# Patient Record
Sex: Male | Born: 1935 | Hispanic: Yes | Smoking: Former smoker
Health system: Southern US, Community
[De-identification: ages and names within clinical notes are randomized; demographics above are authoritative.]

## PROBLEM LIST (undated history)

## (undated) DIAGNOSIS — Z974 Presence of external hearing-aid: Secondary | ICD-10-CM

## (undated) DIAGNOSIS — N189 Chronic kidney disease, unspecified: Secondary | ICD-10-CM

## (undated) DIAGNOSIS — F32A Depression, unspecified: Secondary | ICD-10-CM

## (undated) DIAGNOSIS — M25552 Pain in left hip: Secondary | ICD-10-CM

## (undated) DIAGNOSIS — Z972 Presence of dental prosthetic device (complete) (partial): Secondary | ICD-10-CM

## (undated) DIAGNOSIS — R1032 Left lower quadrant pain: Secondary | ICD-10-CM

## (undated) DIAGNOSIS — R531 Weakness: Secondary | ICD-10-CM

## (undated) DIAGNOSIS — R972 Elevated prostate specific antigen [PSA]: Secondary | ICD-10-CM

## (undated) DIAGNOSIS — K259 Gastric ulcer, unspecified as acute or chronic, without hemorrhage or perforation: Secondary | ICD-10-CM

## (undated) DIAGNOSIS — F329 Major depressive disorder, single episode, unspecified: Secondary | ICD-10-CM

## (undated) DIAGNOSIS — I10 Essential (primary) hypertension: Secondary | ICD-10-CM

## (undated) DIAGNOSIS — K449 Diaphragmatic hernia without obstruction or gangrene: Secondary | ICD-10-CM

## (undated) DIAGNOSIS — K279 Peptic ulcer, site unspecified, unspecified as acute or chronic, without hemorrhage or perforation: Secondary | ICD-10-CM

## (undated) DIAGNOSIS — R0602 Shortness of breath: Secondary | ICD-10-CM

## (undated) DIAGNOSIS — R053 Chronic cough: Secondary | ICD-10-CM

## (undated) DIAGNOSIS — N400 Enlarged prostate without lower urinary tract symptoms: Secondary | ICD-10-CM

## (undated) DIAGNOSIS — K2100 Gastro-esophageal reflux disease with esophagitis, without bleeding: Secondary | ICD-10-CM

## (undated) HISTORY — PX: CHOLECYSTECTOMY: SHX55

## (undated) HISTORY — PX: OTHER SURGICAL HISTORY: SHX169

## (undated) HISTORY — DX: Essential (primary) hypertension: I10

## (undated) HISTORY — DX: Chronic kidney disease, unspecified: N18.9

## (undated) HISTORY — DX: Pain in left hip: M25.552

## (undated) HISTORY — DX: Left lower quadrant pain: R10.32

## (undated) HISTORY — DX: Elevated prostate specific antigen (PSA): R97.20

## (undated) HISTORY — DX: Weakness: R53.1

## (undated) HISTORY — DX: Benign prostatic hyperplasia without lower urinary tract symptoms: N40.0

---
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
## 2002-10-07 HISTORY — PX: LEG SURGERY: SHX1003

## 2013-09-14 ENCOUNTER — Ambulatory Visit: Payer: Self-pay

## 2013-10-23 ENCOUNTER — Emergency Department: Payer: Self-pay | Admitting: Emergency Medicine

## 2013-10-23 LAB — CBC
HCT: 36.9 % — ABNORMAL LOW (ref 40.0–52.0)
HGB: 13 g/dL (ref 13.0–18.0)
MCH: 31 pg (ref 26.0–34.0)
MCHC: 35.2 g/dL (ref 32.0–36.0)
MCV: 88 fL (ref 80–100)
Platelet: 166 10*3/uL (ref 150–440)
RBC: 4.2 10*6/uL — ABNORMAL LOW (ref 4.40–5.90)
RDW: 13.4 % (ref 11.5–14.5)
WBC: 5.8 10*3/uL (ref 3.8–10.6)

## 2013-10-23 LAB — URINALYSIS, COMPLETE
BACTERIA: NONE SEEN
Bilirubin,UR: NEGATIVE
GLUCOSE, UR: NEGATIVE mg/dL (ref 0–75)
KETONE: NEGATIVE
Leukocyte Esterase: NEGATIVE
NITRITE: NEGATIVE
PROTEIN: NEGATIVE
Ph: 6 (ref 4.5–8.0)
Specific Gravity: 1.017 (ref 1.003–1.030)
WBC UR: 1 /HPF (ref 0–5)

## 2013-10-23 LAB — COMPREHENSIVE METABOLIC PANEL
ALBUMIN: 3.6 g/dL (ref 3.4–5.0)
ALT: 30 U/L (ref 12–78)
Alkaline Phosphatase: 86 U/L
Anion Gap: 6 — ABNORMAL LOW (ref 7–16)
BUN: 13 mg/dL (ref 7–18)
Bilirubin,Total: 0.5 mg/dL (ref 0.2–1.0)
CREATININE: 1.09 mg/dL (ref 0.60–1.30)
Calcium, Total: 8.9 mg/dL (ref 8.5–10.1)
Chloride: 104 mmol/L (ref 98–107)
Co2: 27 mmol/L (ref 21–32)
GLUCOSE: 95 mg/dL (ref 65–99)
OSMOLALITY: 274 (ref 275–301)
Potassium: 3.5 mmol/L (ref 3.5–5.1)
SGOT(AST): 19 U/L (ref 15–37)
Sodium: 137 mmol/L (ref 136–145)
Total Protein: 7 g/dL (ref 6.4–8.2)

## 2013-11-09 ENCOUNTER — Ambulatory Visit: Payer: Self-pay | Admitting: Gastroenterology

## 2013-11-11 LAB — PATHOLOGY REPORT

## 2014-03-19 DIAGNOSIS — N183 Chronic kidney disease, stage 3 unspecified: Secondary | ICD-10-CM

## 2014-03-19 HISTORY — DX: Chronic kidney disease, stage 3 unspecified: N18.30

## 2014-07-18 ENCOUNTER — Ambulatory Visit: Payer: Self-pay | Admitting: Internal Medicine

## 2014-07-27 ENCOUNTER — Ambulatory Visit: Payer: Self-pay | Admitting: Gastroenterology

## 2015-03-15 DIAGNOSIS — E782 Mixed hyperlipidemia: Secondary | ICD-10-CM | POA: Insufficient documentation

## 2015-03-15 DIAGNOSIS — I340 Nonrheumatic mitral (valve) insufficiency: Secondary | ICD-10-CM | POA: Insufficient documentation

## 2015-08-01 ENCOUNTER — Encounter: Payer: Self-pay | Admitting: *Deleted

## 2015-08-10 ENCOUNTER — Ambulatory Visit (INDEPENDENT_AMBULATORY_CARE_PROVIDER_SITE_OTHER): Payer: Medicare Other | Admitting: Urology

## 2015-08-10 ENCOUNTER — Encounter: Payer: Self-pay | Admitting: Urology

## 2015-08-10 VITALS — BP 160/64 | HR 88 | Ht 62.0 in | Wt 142.1 lb

## 2015-08-10 DIAGNOSIS — R972 Elevated prostate specific antigen [PSA]: Secondary | ICD-10-CM

## 2015-08-10 DIAGNOSIS — N138 Other obstructive and reflux uropathy: Secondary | ICD-10-CM | POA: Insufficient documentation

## 2015-08-10 DIAGNOSIS — N401 Enlarged prostate with lower urinary tract symptoms: Secondary | ICD-10-CM | POA: Diagnosis not present

## 2015-08-10 NOTE — Progress Notes (Signed)
08/10/2015 10:37 AM   Bradley Wolf Nov 17, 1928 [id number]  Referring provider: No referring provider defined for this encounter.  Chief Complaint  Patient presents with  . Elevated PSA    6 month recheck  . Benign Prostatic Hypertrophy    HPI: Patient is an 81 year old Hispanic male who presents today with interpreter, Bradley Wolf, with a history of elevated PSA and BPH with LUTS who presents today for 6 month follow-up.  BPH WITH LUTS His IPSS score today is 8, which is moderate lower urinary tract symptomatology. He is mixed with his quality life due to his urinary symptoms.  His major complaint today a weak urinary stream at night.  He has nocturia x 1.  He has had these symptoms for several years.  He denies any dysuria, hematuria or suprapubic pain.   He currently taking tamsulosin and finasteride.  He also denies any recent fevers, chills, nausea or vomiting.  He does not have a family history of PCa.      IPSS      08/10/15 0900       International Prostate Symptom Score   How often have you had the sensation of not emptying your bladder? Not at All     How often have you had to urinate less than every two hours? Almost always     How often have you found you stopped and started again several times when you urinated? Less than 1 in 5 times     How often have you found it difficult to postpone urination? Not at All     How often have you had a weak urinary stream? Not at All     How often have you had to strain to start urination? Less than 1 in 5 times     How many times did you typically get up at night to urinate? 1 Time     Total IPSS Score 8     Quality of Life due to urinary symptoms   If you were to spend the rest of your life with your urinary condition just the way it is now how would you feel about that? Mixed        Score:  1-7 Mild 8-19 Moderate 20-35 Severe  Elevated PSA Patient's PSA has been as high as 8.3, but it has reduced with the addition  of finasteride to 3.9.     PMH: Past Medical History  Diagnosis Date  . HTN (hypertension)   . CKD (chronic kidney disease)   . Left hip pain   . BPH (benign prostatic hyperplasia)   . Elevated PSA   . Generalized weakness   . Left groin pain     Surgical History: Past Surgical History  Procedure Laterality Date  . Leg surgery  2004    due to accident    Home Medications:    Medication List       This list is accurate as of: 08/10/15 10:37 AM.  Always use your most recent med list.               acetaminophen 325 MG tablet  Commonly known as:  TYLENOL  Take by mouth.     finasteride 5 MG tablet  Commonly known as:  PROSCAR  Take 5 mg by mouth daily.     fluticasone 50 MCG/ACT nasal spray  Commonly known as:  FLONASE  Place into the nose.     risperiDONE 0.25 MG tablet  Commonly known as:  RISPERDAL  Take 0.25 mg by mouth at bedtime.     tamsulosin 0.4 MG Caps capsule  Commonly known as:  FLOMAX  Take 0.4 mg by mouth.        Allergies: No Known Allergies  Family History: Family History  Problem Relation Age of Onset  . Benign prostatic hyperplasia Brother   . Prostate cancer Neg Hx   . Kidney disease Neg Hx     Social History:  reports that he has never smoked. He does not have any smokeless tobacco history on file. He reports that he does not drink alcohol or use illicit drugs.  ROS: UROLOGY Frequent Urination?: No Hard to postpone urination?: No Burning/pain with urination?: No Get up at night to urinate?: No Leakage of urine?: No Urine stream starts and stops?: No Trouble starting stream?: Yes Do you have to strain to urinate?: No Blood in urine?: No Urinary tract infection?: No Sexually transmitted disease?: No Injury to kidneys or bladder?: No Painful intercourse?: No Weak stream?: No Erection problems?: No Penile pain?: No  Gastrointestinal Nausea?: No Vomiting?: No Indigestion/heartburn?: No Diarrhea?: No Constipation?:  No  Constitutional Fever: No Night sweats?: No Weight loss?: No Fatigue?: No  Skin Skin rash/lesions?: No Itching?: No  Eyes Blurred vision?: No Double vision?: No  Ears/Nose/Throat Sore throat?: No Sinus problems?: No  Hematologic/Lymphatic Swollen glands?: No Easy bruising?: No  Cardiovascular Leg swelling?: No Chest pain?: No  Respiratory Cough?: No Shortness of breath?: No  Endocrine Excessive thirst?: No  Musculoskeletal Back pain?: No Joint pain?: No  Neurological Headaches?: No Dizziness?: No  Psychologic Depression?: No Anxiety?: No  Physical Exam: BP 160/64 mmHg  Pulse 88  Ht 5\' 2"  (1.575 m)  Wt 142 lb 1.6 oz (64.456 kg)  BMI 25.98 kg/m2  GU: No CVA tenderness.  No bladder fullness or masses.  Patient with uncircumcised phallus. Foreskin easily retracted  Urethral meatus is patent.  No penile discharge. No penile lesions or rashes. Scrotum without lesions, cysts, rashes and/or edema.  Testicles are located scrotally bilaterally. No masses are appreciated in the testicles. Left and right epididymis are normal. Rectal: Patient with  normal sphincter tone. Anus and perineum without scarring or rashes. No rectal masses are appreciated. Prostate is approximately 90 grams (could not palpated entire gland due to buttocks tissue)  Seminal vesicles are normal.   Laboratory Data: Lab Results  Component Value Date   WBC 5.8 10/23/2013   HGB 13.0 10/23/2013   HCT 36.9* 10/23/2013   MCV 88 10/23/2013   PLT 166 10/23/2013    Lab Results  Component Value Date   CREATININE 1.09 10/23/2013  PSA History:  7.9 ng/mL on 04/14/2014  8.3 ng/mL on 06/15/2014  3.9 ng/mL on 02/09/2015    Assessment & Plan:    1. BPH (benign prostatic hyperplasia) with LUTS:    IPSS score is 8/3.  He is mixed because he does not like to take the medications.  I stated he could discontinue the medications and restart them if his urinary symptoms worsened.  He will RTC in 6  months for PSA, exam and IPSS score.  - PSA  2. Elevated PSA:    Patient's PSA has been as high as 8.3, but it has reduced with the addition of finasteride to 3.9.   It may start to increase if he discontinues the finasteride.  He will RTC in 6 months for a PSA and exam.   Return in about 6 months (around 02/07/2016) for IPSS and  PVR.  Zara Council, [facility] [address] [place], [place] [address] [phone]

## 2015-08-11 LAB — PSA: PROSTATE SPECIFIC AG, SERUM: 2.5 ng/mL (ref 0.0–4.0)

## 2016-01-31 ENCOUNTER — Other Ambulatory Visit: Payer: Self-pay

## 2016-01-31 DIAGNOSIS — R972 Elevated prostate specific antigen [PSA]: Secondary | ICD-10-CM

## 2016-02-01 ENCOUNTER — Other Ambulatory Visit: Payer: Medicare Other

## 2016-02-01 DIAGNOSIS — R972 Elevated prostate specific antigen [PSA]: Secondary | ICD-10-CM

## 2016-02-02 LAB — PSA: PROSTATE SPECIFIC AG, SERUM: 1.9 ng/mL (ref 0.0–4.0)

## 2016-02-08 ENCOUNTER — Ambulatory Visit: Payer: Medicare Other | Admitting: Urology

## 2016-04-23 ENCOUNTER — Ambulatory Visit: Payer: Medicare Other | Admitting: Urology

## 2016-04-25 ENCOUNTER — Ambulatory Visit (INDEPENDENT_AMBULATORY_CARE_PROVIDER_SITE_OTHER): Payer: Medicare Other | Admitting: Urology

## 2016-04-25 ENCOUNTER — Encounter: Payer: Self-pay | Admitting: Urology

## 2016-04-25 VITALS — BP 169/66 | HR 72 | Ht 63.0 in | Wt 151.9 lb

## 2016-04-25 DIAGNOSIS — N528 Other male erectile dysfunction: Secondary | ICD-10-CM | POA: Diagnosis not present

## 2016-04-25 DIAGNOSIS — N400 Enlarged prostate without lower urinary tract symptoms: Secondary | ICD-10-CM | POA: Diagnosis not present

## 2016-04-25 DIAGNOSIS — N401 Enlarged prostate with lower urinary tract symptoms: Secondary | ICD-10-CM | POA: Diagnosis not present

## 2016-04-25 DIAGNOSIS — R972 Elevated prostate specific antigen [PSA]: Secondary | ICD-10-CM

## 2016-04-25 DIAGNOSIS — N529 Male erectile dysfunction, unspecified: Secondary | ICD-10-CM

## 2016-04-25 DIAGNOSIS — N138 Other obstructive and reflux uropathy: Secondary | ICD-10-CM

## 2016-04-25 LAB — BLADDER SCAN AMB NON-IMAGING: SCAN RESULT: 0

## 2016-04-25 NOTE — Progress Notes (Signed)
10:06 AM   Bradley Wolf 11-Sep-1929 [id number]  Referring provider: Lauro Regulus, MD [address] [facility] [place], [place] [address]  Chief Complaint  Patient presents with  . Benign Prostatic Hypertrophy    6 month follow up   . Elevated PSA    HPI: Patient is an 82 year old Hispanic male who presents today with interpreter, [name], with a history of elevated PSA and BPH with LUTS who presents today for 6 month follow-up.  BPH WITH LUTS His IPSS score today is 17, which is moderate lower urinary tract symptomatology. He is mostly dissatisfied with his quality life due to his urinary symptoms.  His major complaint today a weak and slow urinary stream at night.   He has had these symptoms for several years.  He denies any dysuria, hematuria or suprapubic pain.   He currently taking tamsulosin and finasteride.  He also denies any recent fevers, chills, nausea or vomiting.  He does not have a family history of PCa.      IPSS      04/25/16 0900       International Prostate Symptom Score   How often have you had the sensation of not emptying your bladder? More than half the time     How often have you had to urinate less than every two hours? About half the time     How often have you found you stopped and started again several times when you urinated? More than half the time     How often have you found it difficult to postpone urination? Not at All     How often have you had a weak urinary stream? About half the time     How often have you had to strain to start urination? Not at All     How many times did you typically get up at night to urinate? 3 Times     Total IPSS Score 17     Quality of Life due to urinary symptoms   If you were to spend the rest of your life with your urinary condition just the way it is now how would you feel about that? Mostly Disatisfied        Score:  1-7 Mild 8-19 Moderate 20-35 Severe  Elevated  PSA Patient's PSA has been as high as 8.3, but it has reduced with the addition of finasteride to 3.9.  In April, it was 1.9 ng/mL.    Erectile dysfunction Patient has been a widower for three years.  He has now recently become involved with a woman and cannot have a firm erection.  He has a good libido.  He denies painful erections and curvature to his erections.    PMH: Past Medical History  Diagnosis Date  . HTN (hypertension)   . CKD (chronic kidney disease)   . Left hip pain   . BPH (benign prostatic hyperplasia)   . Elevated PSA   . Generalized weakness   . Left groin pain     Surgical History: Past Surgical History  Procedure Laterality Date  . Leg surgery  2004    due to accident    Home Medications:    Medication List       This list is accurate as of: 04/25/16 10:06 AM.  Always use your most recent med list.               acetaminophen 325 MG tablet  Commonly known as:  TYLENOL  Take by mouth. Reported on 04/25/2016     azelastine 0.1 % nasal spray  Commonly known as:  ASTELIN  Place into the nose.     finasteride 5 MG tablet  Commonly known as:  PROSCAR  Take 5 mg by mouth daily.     fluticasone 50 MCG/ACT nasal spray  Commonly known as:  FLONASE  Place into the nose.     pantoprazole 40 MG tablet  Commonly known as:  PROTONIX  Take by mouth.     risperiDONE 0.25 MG tablet  Commonly known as:  RISPERDAL  Take 0.25 mg by mouth at bedtime.     tamsulosin 0.4 MG Caps capsule  Commonly known as:  FLOMAX  Take 0.4 mg by mouth.        Allergies: No Known Allergies  Family History: Family History  Problem Relation Age of Onset  . Benign prostatic hyperplasia Brother   . Prostate cancer Neg Hx   . Kidney disease Neg Hx     Social History:  reports that he has never smoked. He does not have any smokeless tobacco history on file. He reports that he does not drink alcohol or use illicit drugs.  ROS: UROLOGY Frequent Urination?: Yes Hard  to postpone urination?: Yes Burning/pain with urination?: No Get up at night to urinate?: Yes Leakage of urine?: No Urine stream starts and stops?: Yes Trouble starting stream?: No Do you have to strain to urinate?: Yes Blood in urine?: No Urinary tract infection?: No Sexually transmitted disease?: No Injury to kidneys or bladder?: No Painful intercourse?: No Weak stream?: Yes Erection problems?: No Penile pain?: No  Gastrointestinal Nausea?: No Vomiting?: No Indigestion/heartburn?: No Diarrhea?: No Constipation?: No  Constitutional Fever: No Night sweats?: No Weight loss?: No Fatigue?: No  Skin Skin rash/lesions?: Yes Itching?: No  Eyes Blurred vision?: No Double vision?: No  Ears/Nose/Throat Sore throat?: No Sinus problems?: No  Hematologic/Lymphatic Swollen glands?: No Easy bruising?: No  Cardiovascular Leg swelling?: No Chest pain?: No  Respiratory Cough?: No Shortness of breath?: No  Endocrine Excessive thirst?: No  Musculoskeletal Back pain?: No Joint pain?: No  Neurological Headaches?: No Dizziness?: No  Psychologic Depression?: No Anxiety?: No  Physical Exam: BP 169/66 mmHg  Pulse 72  Ht  (1.6 m)  Wt 151 lb 14.4 oz (68.901 kg)  BMI 26.91 kg/m2  GU: No CVA tenderness.  No bladder fullness or masses.  Patient with uncircumcised phallus. Foreskin easily retracted  Urethral meatus is patent.  No penile discharge. No penile lesions or rashes. Scrotum without lesions, cysts, rashes and/or edema.  Testicles are located scrotally bilaterally. No masses are appreciated in the testicles. Left and right epididymis are normal. Rectal: Patient with  normal sphincter tone. Anus and perineum without scarring or rashes. No rectal masses are appreciated. Prostate is approximately 90 grams (could not palpated entire gland due to buttocks tissue)  Seminal vesicles are normal.   Laboratory Data: Lab Results  Component Value Date   WBC 5.8  10/23/2013   HGB 13.0 10/23/2013   HCT 36.9* 10/23/2013   MCV 88 10/23/2013   PLT 166 10/23/2013    Lab Results  Component Value Date   CREATININE 1.09 10/23/2013  PSA History:  7.9 ng/mL on 04/14/2014  8.3 ng/mL on 06/15/2014  3.9 ng/mL on 02/09/2015  1.9 ng/mL on 02/01/2016    Assessment & Plan:    1. BPH (benign prostatic hyperplasia) with LUTS:    IPSS score  is 17/4.  We will have a trial of Cialis 5 mg daily in place of the tamsulosin.   He will RTC in one months for an IPSS score.  If this is not effective, he will need a cystoscopy to evaluate for BOO and a possible bladder outlet procedure.    2. Elevated PSA:    Patient's PSA has been as high as 8.3, but it has reduced with the addition of finasteride to 1.9.    3. Erectile dysfunction:   Patient is naive to PDE5-inhibitors.  We will have a trial of daily Cialis 5 mg.  He is warned not to take the Cialis with medications that contain nitrates.  I also advised him of the side effects, such as: headache, flushing, dyspepsia, abnormal vision, nasal congestion, back pain, myalgia, nausea, dizziness, and rash.  He will RTC in one month for SHIM score.     Return in about 1 month (around 05/26/2016) for IPSS and SHIM.  Michiel CowboySHANNON Aleksis Jiggetts, PA-C  [facility] [address], [address] [place], [place] [address] [phone]

## 2016-05-27 ENCOUNTER — Ambulatory Visit (INDEPENDENT_AMBULATORY_CARE_PROVIDER_SITE_OTHER): Payer: Medicare Other | Admitting: Urology

## 2016-05-27 ENCOUNTER — Encounter: Payer: Self-pay | Admitting: Urology

## 2016-05-27 VITALS — BP 164/67 | HR 87 | Ht 60.0 in | Wt 156.3 lb

## 2016-05-27 DIAGNOSIS — N401 Enlarged prostate with lower urinary tract symptoms: Secondary | ICD-10-CM | POA: Diagnosis not present

## 2016-05-27 DIAGNOSIS — R972 Elevated prostate specific antigen [PSA]: Secondary | ICD-10-CM

## 2016-05-27 DIAGNOSIS — N138 Other obstructive and reflux uropathy: Secondary | ICD-10-CM

## 2016-05-27 MED ORDER — TAMSULOSIN HCL 0.4 MG PO CAPS: 0.4000 mg | ORAL_CAPSULE | Freq: Every day | ORAL | 3 refills | Status: DC

## 2016-05-27 NOTE — Progress Notes (Signed)
11:17 AM   Bradley Wolf Jul 26, 1929 [id number]  Referring provider: Lauro Regulus, MD [address] [facility] [place], [place] [address]  Chief Complaint  Patient presents with  . Benign Prostatic Hypertrophy    1 month follow up  . Elevated PSA    HPI: Patient is an 82 year old Hispanic male who presents today with interpreter, Kandis Cocking, with a history of elevated PSA and BPH with LUTS who presents today for 6 month follow-up.  BPH WITH LUTS His IPSS score today is 11, which is moderate lower urinary tract symptomatology. He is mixed with his quality life due to his urinary symptoms.  His previous IPSS score was 17/4.  His major complaint today a weak and slow urinary stream at night.   He has had these symptoms for several years.  He denies any dysuria, hematuria or suprapubic pain.   He currently taking Cialis 5 mg daily and finasteride 5 mg daily.  His insurance will not cover the Cialis and it is too expensive to buy out of pocket.  He also denies any recent fevers, chills, nausea or vomiting.  He does not have a family history of PCa.      IPSS    Row Name 04/25/16 0900 05/27/16 1000       International Prostate Symptom Score   How often have you had the sensation of not emptying your bladder? More than half the time Less than 1 in 5    How often have you had to urinate less than every two hours? About half the time Less than half the time    How often have you found you stopped and started again several times when you urinated? More than half the time About half the time    How often have you found it difficult to postpone urination? Not at All Less than 1 in 5 times    How often have you had a weak urinary stream? About half the time Less than half the time    How often have you had to strain to start urination? Not at All Not at All    How many times did you typically get up at night to urinate? 3 Times 2 Times    Total IPSS Score 17 11        Quality of Life due to urinary symptoms   If you were to spend the rest of your life with your urinary condition just the way it is now how would you feel about that? Mostly Disatisfied Mixed       Score:  1-7 Mild 8-19 Moderate 20-35 Severe  Elevated PSA Patient's PSA has been as high as 8.3, but it has reduced with the addition of finasteride to 3.9.  In April, it was 1.9 ng/mL.  No longer screening at this time.    Erectile dysfunction Patient has been a widower for four years.  He is no longer in a relationship.    PMH: Past Medical History:  Diagnosis Date  . BPH (benign prostatic hyperplasia)   . CKD (chronic kidney disease)   . Elevated PSA   . Generalized weakness   . HTN (hypertension)   . Left groin pain   . Left hip pain     Surgical History: Past Surgical History:  Procedure Laterality Date  . LEG SURGERY  2004   due to accident    Home Medications:    Medication List  Accurate as of 05/27/16 11:17 AM. Always use your most recent med list.          acetaminophen 325 MG tablet Commonly known as:  TYLENOL Take by mouth. Reported on 04/25/2016   azelastine 0.1 % nasal spray Commonly known as:  ASTELIN Place into the nose.   finasteride 5 MG tablet Commonly known as:  PROSCAR   fluticasone 50 MCG/ACT nasal spray Commonly known as:  FLONASE Place into the nose.   pantoprazole 40 MG tablet Commonly known as:  PROTONIX Take by mouth.   risperiDONE 0.25 MG tablet Commonly known as:  RISPERDAL Take 0.25 mg by mouth at bedtime.   tamsulosin 0.4 MG Caps capsule Commonly known as:  FLOMAX Take 1 capsule (0.4 mg total) by mouth daily.       Allergies: No Known Allergies  Family History: Family History  Problem Relation Age of Onset  . Benign prostatic hyperplasia Brother   . Prostate cancer Neg Hx   . Kidney disease Neg Hx     Social History:  reports that he has never smoked. He does not have any smokeless tobacco history  on file. He reports that he does not drink alcohol or use drugs.  ROS: UROLOGY Frequent Urination?: Yes Hard to postpone urination?: No Burning/pain with urination?: No Get up at night to urinate?: Yes Leakage of urine?: No Urine stream starts and stops?: Yes Trouble starting stream?: No Do you have to strain to urinate?: No Blood in urine?: No Urinary tract infection?: No Sexually transmitted disease?: No Injury to kidneys or bladder?: No Painful intercourse?: No Weak stream?: No Erection problems?: Yes Penile pain?: Yes  Gastrointestinal Nausea?: No Vomiting?: No Indigestion/heartburn?: No Diarrhea?: No Constipation?: Yes  Constitutional Fever: No Night sweats?: No Weight loss?: No Fatigue?: No  Skin Skin rash/lesions?: No Itching?: No  Eyes Blurred vision?: No Double vision?: No  Ears/Nose/Throat Sore throat?: No Sinus problems?: No  Hematologic/Lymphatic Swollen glands?: No Easy bruising?: No  Cardiovascular Leg swelling?: No Chest pain?: No  Respiratory Cough?: No Shortness of breath?: No  Endocrine Excessive thirst?: No  Musculoskeletal Back pain?: No Joint pain?: No  Neurological Headaches?: No Dizziness?: No  Psychologic Depression?: No Anxiety?: No  Physical Exam: BP (!) 164/67   Pulse 87   Ht 5' (1.524 m)   Wt 156 lb 4.8 oz (70.9 kg)   BMI 30.53 kg/m   GU: No CVA tenderness.  No bladder fullness or masses.  Patient with uncircumcised phallus. Foreskin easily retracted  Urethral meatus is patent.  No penile discharge. No penile lesions or rashes. Scrotum without lesions, cysts, rashes and/or edema.  Testicles are located scrotally bilaterally. No masses are appreciated in the testicles. Left and right epididymis are normal. Rectal: Patient with  normal sphincter tone. Anus and perineum without scarring or rashes. No rectal masses are appreciated. Prostate is approximately 90 grams (could not palpated entire gland due to  buttocks tissue)  Seminal vesicles are normal.   Laboratory Data: Lab Results  Component Value Date   WBC 5.8 10/23/2013   HGB 13.0 10/23/2013   HCT 36.9 (L) 10/23/2013   MCV 88 10/23/2013   PLT 166 10/23/2013    Lab Results  Component Value Date   CREATININE 1.09 10/23/2013  PSA History:  7.9 ng/mL on 04/14/2014  8.3 ng/mL on 06/15/2014  3.9 ng/mL on 02/09/2015  1.9 ng/mL on 02/01/2016    Assessment & Plan:    1. BPH with LUTS  - IPSS score is 11/3,  it is improving  - Continue conservative management, avoiding bladder irritants and timed voiding's  - Initiate alpha-blocker (tamsulosin), discussed side effects  - Continue finasteride 5 mg daily  - RTC in 6 months for IPSS and exam   2. Elevated PSA:    Patient's PSA has been as high as 8.3, but it has reduced with the addition of finasteride to 1.9.  No longer screening.    3. Erectile dysfunction:   Not addressed at this visit.    Return in about 6 months (around 11/27/2016) for IPSS and exam.  Michiel CowboySHANNON Alexzavier Girardin, [facility]  [facility] [address], [address] [place], [place] [address] [phone]

## 2016-11-27 ENCOUNTER — Ambulatory Visit: Payer: Medicare Other | Admitting: Urology

## 2016-11-28 ENCOUNTER — Ambulatory Visit: Payer: Medicare Other | Admitting: Urology

## 2016-11-28 NOTE — Progress Notes (Deleted)
1:35 PM   Bradley Wolf 04-Aug-1929 [id number]  Referring provider: Lauro Regulus, MD [address] [facility] [place], [place] [address]  No chief complaint on file.   HPI: Patient is an 83 year old Hispanic male who presents today with interpreter, ***, with a history of elevated PSA and BPH with LUTS who presents today for 6 month follow-up.  BPH WITH LUTS His IPSS score today is ***, which is *** lower urinary tract symptomatology. He is *** with his quality life due to his urinary symptoms.  His previous IPSS score was 11/3.  His major complaint today a weak and slow urinary stream at night.   He has had these symptoms for several years.  He denies any dysuria, hematuria or suprapubic pain.   He currently taking Cialis 5 mg daily and finasteride 5 mg daily.  His insurance will not cover the Cialis and it is too expensive to buy out of pocket.  He also denies any recent fevers, chills, nausea or vomiting.  He does not have a family history of PCa.    Score:  1-7 Mild 8-19 Moderate 20-35 Severe   PMH: Past Medical History:  Diagnosis Date  . BPH (benign prostatic hyperplasia)   . CKD (chronic kidney disease)   . Elevated PSA   . Generalized weakness   . HTN (hypertension)   . Left groin pain   . Left hip pain     Surgical History: Past Surgical History:  Procedure Laterality Date  . LEG SURGERY  2004   due to accident    Home Medications:  Allergies as of 11/28/2016   No Known Allergies     Medication List       Accurate as of 11/28/16  1:35 PM. Always use your most recent med list.          acetaminophen 325 MG tablet Commonly known as:  TYLENOL Take by mouth. Reported on 04/25/2016   azelastine 0.1 % nasal spray Commonly known as:  ASTELIN Place into the nose.   finasteride 5 MG tablet Commonly known as:  PROSCAR   fluticasone 50 MCG/ACT nasal spray Commonly known as:  FLONASE Place into the nose.     pantoprazole 40 MG tablet Commonly known as:  PROTONIX Take by mouth.   risperiDONE 0.25 MG tablet Commonly known as:  RISPERDAL Take 0.25 mg by mouth at bedtime.   tamsulosin 0.4 MG Caps capsule Commonly known as:  FLOMAX Take 1 capsule (0.4 mg total) by mouth daily.       Allergies: No Known Allergies  Family History: Family History  Problem Relation Age of Onset  . Benign prostatic hyperplasia Brother   . Prostate cancer Neg Hx   . Kidney disease Neg Hx     Social History:  reports that he has never smoked. He does not have any smokeless tobacco history on file. He reports that he does not drink alcohol or use drugs.  ROS:                                        Physical Exam: There were no vitals taken for this visit.  GU: No CVA tenderness.  No bladder fullness or masses.  Patient with uncircumcised phallus. Foreskin easily retracted  Urethral meatus is patent.  No penile discharge. No penile lesions or rashes. Scrotum without lesions, cysts,  rashes and/or edema.  Testicles are located scrotally bilaterally. No masses are appreciated in the testicles. Left and right epididymis are normal. Rectal: Patient with  normal sphincter tone. Anus and perineum without scarring or rashes. No rectal masses are appreciated. Prostate is approximately 90 grams (could not palpated entire gland due to buttocks tissue)  Seminal vesicles are normal.   Laboratory Data: Lab Results  Component Value Date   WBC 5.8 10/23/2013   HGB 13.0 10/23/2013   HCT 36.9 (L) 10/23/2013   MCV 88 10/23/2013   PLT 166 10/23/2013    Lab Results  Component Value Date   CREATININE 1.09 10/23/2013  PSA History:  7.9 ng/mL on 04/14/2014  8.3 ng/mL on 06/15/2014  3.9 ng/mL on 02/09/2015  1.9 ng/mL on 02/01/2016    Assessment & Plan:    1. BPH with LUTS  - IPSS score is 11/3, it is improving  - Continue conservative management, avoiding bladder irritants and timed  voiding's  - Initiate alpha-blocker (tamsulosin), discussed side effects  - Continue finasteride 5 mg daily; refills given  - RTC in 6 months for IPSS and exam     No Follow-up on file.  Michiel CowboySHANNON Briggitte Boline, PA-C  [facility] [address], [address] [place], [place] [address] [phone]

## 2016-12-12 ENCOUNTER — Ambulatory Visit (INDEPENDENT_AMBULATORY_CARE_PROVIDER_SITE_OTHER): Payer: Medicare Other | Admitting: Urology

## 2016-12-12 ENCOUNTER — Encounter: Payer: Self-pay | Admitting: Urology

## 2016-12-12 VITALS — BP 175/64 | HR 82 | Ht 60.0 in | Wt 149.0 lb

## 2016-12-12 DIAGNOSIS — N138 Other obstructive and reflux uropathy: Secondary | ICD-10-CM

## 2016-12-12 DIAGNOSIS — N401 Enlarged prostate with lower urinary tract symptoms: Secondary | ICD-10-CM

## 2016-12-12 LAB — BLADDER SCAN AMB NON-IMAGING: Scan Result: 15

## 2016-12-12 MED ORDER — ALFUZOSIN HCL ER 10 MG PO TB24: 10.0000 mg | ORAL_TABLET | Freq: Every day | ORAL | 11 refills | Status: DC

## 2016-12-12 NOTE — Progress Notes (Signed)
11:27 AM   Bradley Wolf 05-Nov-1928 [id number]  Referring provider: Lauro Regulus, MD [address] [facility] [place] - [place], [place] [address]  Chief Complaint  Patient presents with  . Benign Prostatic Hypertrophy    HPI: Patient is an 83 year old Hispanic male who presents today with interpreter, [name], with a history of elevated PSA and BPH with LUTS who presents today for 6 month follow-up.  BPH WITH LUTS His IPSS score today is 12, which is moderate lower urinary tract symptomatology. He is mixed with his quality life due to his urinary symptoms.  His previous IPSS score was 11/3.  His major complaint today a weak and slow urinary stream at night.   He has had these symptoms for several years.  He denies any dysuria, hematuria or suprapubic pain.   He currently taking tamsulosin 0.4 mg daily and finasteride 5 mg daily.  His insurance will not cover the Cialis and it is too expensive to buy out of pocket.  He also denies any recent fevers, chills, nausea or vomiting.  He does not have a family history of PCa.      IPSS    Row Name 12/12/16 1100         International Prostate Symptom Score   How often have you had the sensation of not emptying your bladder? Less than 1 in 5     How often have you had to urinate less than every two hours? Less than 1 in 5 times     How often have you found you stopped and started again several times when you urinated? Less than 1 in 5 times     How often have you found it difficult to postpone urination? Less than 1 in 5 times     How often have you had a weak urinary stream? Almost always     How often have you had to strain to start urination? Less than 1 in 5 times     How many times did you typically get up at night to urinate? 2 Times     Total IPSS Score 12       Quality of Life due to urinary symptoms   If you were to spend the rest of your life with your urinary condition just the way it is now how would you  feel about that? Mixed        Score:  1-7 Mild 8-19 Moderate 20-35 Severe   PMH: Past Medical History:  Diagnosis Date  . BPH (benign prostatic hyperplasia)   . CKD (chronic kidney disease)   . Elevated PSA   . Generalized weakness   . HTN (hypertension)   . Left groin pain   . Left hip pain     Surgical History: Past Surgical History:  Procedure Laterality Date  . LEG SURGERY  2004   due to accident    Home Medications:  Allergies as of 12/12/2016   No Known Allergies     Medication List       Accurate as of 12/12/16 11:27 AM. Always use your most recent med list.          acetaminophen 325 MG tablet Commonly known as:  TYLENOL Take by mouth. Reported on 04/25/2016   alfuzosin 10 MG 24 hr tablet Commonly known as:  UROXATRAL Take 1 tablet (10 mg total) by mouth daily with breakfast.   azelastine 0.1 % nasal spray Commonly known as:  ASTELIN  Place into the nose.   finasteride 5 MG tablet Commonly known as:  PROSCAR   fluticasone 50 MCG/ACT nasal spray Commonly known as:  FLONASE Place into the nose.   pantoprazole 40 MG tablet Commonly known as:  PROTONIX Take by mouth.   risperiDONE 0.25 MG tablet Commonly known as:  RISPERDAL Take 0.25 mg by mouth at bedtime.   senna 8.6 MG Tabs tablet Commonly known as:  SENOKOT Take 1 tablet by mouth.   tamsulosin 0.4 MG Caps capsule Commonly known as:  FLOMAX Take 1 capsule (0.4 mg total) by mouth daily.       Allergies: No Known Allergies  Family History: Family History  Problem Relation Age of Onset  . Benign prostatic hyperplasia Brother   . Prostate cancer Neg Hx   . Kidney disease Neg Hx     Social History:  reports that he has never smoked. He has never used smokeless tobacco. He reports that he does not drink alcohol or use drugs.  ROS: UROLOGY Frequent Urination?: Yes Hard to postpone urination?: No Burning/pain with urination?: No Get up at night to urinate?: No Leakage of  urine?: No Urine stream starts and stops?: Yes Trouble starting stream?: No Do you have to strain to urinate?: No Blood in urine?: No Urinary tract infection?: No Sexually transmitted disease?: No Injury to kidneys or bladder?: No Painful intercourse?: No Weak stream?: Yes Erection problems?: No Penile pain?: No  Gastrointestinal Nausea?: No Vomiting?: No Indigestion/heartburn?: No Diarrhea?: No Constipation?: No  Constitutional Fever: No Night sweats?: No Weight loss?: No Fatigue?: Yes  Skin Skin rash/lesions?: No Itching?: No  Eyes Blurred vision?: No Double vision?: No  Ears/Nose/Throat Sore throat?: No Sinus problems?: No  Hematologic/Lymphatic Swollen glands?: No Easy bruising?: No  Cardiovascular Leg swelling?: No Chest pain?: No  Respiratory Cough?: No Shortness of breath?: No  Endocrine Excessive thirst?: No  Musculoskeletal Back pain?: No Joint pain?: No  Neurological Headaches?: No Dizziness?: No  Psychologic Depression?: No Anxiety?: No  Physical Exam: BP (!) 175/64 (BP Location: Left Arm, Patient Position: Sitting, Cuff Size: Normal)   Pulse 82   Ht 5' (1.524 m)   Wt 149 lb (67.6 kg)   BMI 29.10 kg/m   GU: No CVA tenderness.  No bladder fullness or masses.  Patient with uncircumcised phallus. Foreskin easily retracted  Urethral meatus is patent.  No penile discharge. No penile lesions or rashes. Scrotum without lesions, cysts, rashes and/or edema.  Testicles are located scrotally bilaterally. No masses are appreciated in the testicles. Left and right epididymis are normal. Rectal: Patient with  normal sphincter tone. Anus and perineum without scarring or rashes. No rectal masses are appreciated. Prostate is approximately 90 grams (could not palpated entire gland due to buttocks tissue)  Seminal vesicles are normal.   Laboratory Data: Lab Results  Component Value Date   WBC 5.8 10/23/2013   HGB 13.0 10/23/2013   HCT 36.9 (L)  10/23/2013   MCV 88 10/23/2013   PLT 166 10/23/2013    Lab Results  Component Value Date   CREATININE 1.09 10/23/2013  PSA History:  7.9 ng/mL on 04/14/2014  8.3 ng/mL on 06/15/2014  3.9 ng/mL on 02/09/2015  1.9 ng/mL on 02/01/2016    Assessment & Plan:    1. BPH with LUTS  - IPSS score is 12/3, it is worsening  - Continue conservative management, avoiding bladder irritants and timed voiding's  - Change alpha-blocker to alfuzosin 10 mg daily; script given   -  Continue finasteride 5 mg daily; refills given  - RTC in 6 months for IPSS and exam   Return in about 2 weeks (around 12/26/2016) for IPSS and PVR.  Michiel Cowboy, PA-C  [facility] [address], [address] [place], [place] [address] [phone]

## 2016-12-20 ENCOUNTER — Telehealth: Payer: Self-pay

## 2016-12-20 NOTE — Telephone Encounter (Signed)
PA for alfuzosin denied. Is there anything else pt can have?

## 2016-12-20 NOTE — Telephone Encounter (Signed)
Would he be willing to buy the Cialis troches at that pharmacy like the other patient?   There is a pharmacy near [facility] [phone]) - that compounds the active ingredient Tadalafil into 2.5mg  troches that dissolve in the mouth. I talked with the pharmacist and he said making the troches is how they are able to avoid the patent issues. They will make 30 at a time for $38.

## 2016-12-20 NOTE — Telephone Encounter (Signed)
Spoke with G, interpreter, from [facility] in reference to cialis troches. G is going to speak with pt and call back. No medication will be ordered until G returns call.

## 2016-12-20 NOTE — Telephone Encounter (Signed)
Spoke with Market researcherTiffany at [facility]. Gave verbal orders for medication. Tiffany stated after they got payment they would mail pt medications.

## 2016-12-25 ENCOUNTER — Telehealth: Payer: Self-pay | Admitting: Urology

## 2016-12-25 NOTE — Telephone Encounter (Signed)
Spoke with Tiffany at [facility]. All questions were answered.

## 2016-12-25 NOTE — Telephone Encounter (Signed)
Tiffany (pharmacy) called left message on VM asking to have someone call her back at [phone] for Rx Question. No specifics were given. Just pt name and date of birth. Please advise. Thanks.

## 2016-12-31 ENCOUNTER — Ambulatory Visit: Payer: Medicare Other | Admitting: Urology

## 2017-01-02 ENCOUNTER — Ambulatory Visit: Payer: Medicare Other | Admitting: Urology

## 2017-01-07 ENCOUNTER — Ambulatory Visit: Payer: Medicare Other | Admitting: Urology

## 2017-01-22 NOTE — Progress Notes (Deleted)
8:50 PM   Bradley Wolf November 14, 1928 [id number]  Referring provider: Lauro Regulus, MD [address] [facility] - [place], [place] [address]  No chief complaint on file.   HPI: Patient is an 83 year old Hispanic male who presents today with interpreter, ***, with a history of elevated PSA and BPH with LUTS who presents today for 2 week follow-up after a trial of Cialis 2.5 mg daily for a weak urinary stream.  BPH WITH LUTS His IPSS score today is ***, which is *** lower urinary tract symptomatology. He is *** with his quality life due to his urinary symptoms.  His previous IPSS score was 12/3.  His major complaint today a weak and slow urinary stream at night.   He has had these symptoms for several years.  He denies any dysuria, hematuria or suprapubic pain.   He currently taking tamsulosin 0.4 mg daily and finasteride 5 mg daily.  His insurance will not cover the Cialis and it is too expensive to buy out of pocket.  He also denies any recent fevers, chills, nausea or vomiting.  He does not have a family history of PCa.      IPSS    Row Name 12/12/16 1100         International Prostate Symptom Score   How often have you had the sensation of not emptying your bladder? Less than 1 in 5     How often have you had to urinate less than every two hours? Less than 1 in 5 times     How often have you found you stopped and started again several times when you urinated? Less than 1 in 5 times     How often have you found it difficult to postpone urination? Less than 1 in 5 times     How often have you had a weak urinary stream? Almost always     How often have you had to strain to start urination? Less than 1 in 5 times     How many times did you typically get up at night to urinate? 2 Times     Total IPSS Score 12       Quality of Life due to urinary symptoms   If you were to spend the rest of your life with your urinary condition just the way it is now how  would you feel about that? Mixed        Score:  1-7 Mild 8-19 Moderate 20-35 Severe   PMH: Past Medical History:  Diagnosis Date  . BPH (benign prostatic hyperplasia)   . CKD (chronic kidney disease)   . Elevated PSA   . Generalized weakness   . HTN (hypertension)   . Left groin pain   . Left hip pain     Surgical History: Past Surgical History:  Procedure Laterality Date  . LEG SURGERY  2004   due to accident    Home Medications:  Allergies as of 01/23/2017   No Known Allergies     Medication List       Accurate as of 01/22/17  8:50 PM. Always use your most recent med list.          acetaminophen 325 MG tablet Commonly known as:  TYLENOL Take by mouth. Reported on 04/25/2016   alfuzosin 10 MG 24 hr tablet Commonly known as:  UROXATRAL Take 1 tablet (10 mg total) by mouth daily with breakfast.   azelastine 0.1 %  nasal spray Commonly known as:  ASTELIN Place into the nose.   finasteride 5 MG tablet Commonly known as:  PROSCAR   fluticasone 50 MCG/ACT nasal spray Commonly known as:  FLONASE Place into the nose.   pantoprazole 40 MG tablet Commonly known as:  PROTONIX Take by mouth.   risperiDONE 0.25 MG tablet Commonly known as:  RISPERDAL Take 0.25 mg by mouth at bedtime.   senna 8.6 MG Tabs tablet Commonly known as:  SENOKOT Take 1 tablet by mouth.   tamsulosin 0.4 MG Caps capsule Commonly known as:  FLOMAX Take 1 capsule (0.4 mg total) by mouth daily.       Allergies: No Known Allergies  Family History: Family History  Problem Relation Age of Onset  . Benign prostatic hyperplasia Brother   . Prostate cancer Neg Hx   . Kidney disease Neg Hx     Social History:  reports that he has never smoked. He has never used smokeless tobacco. He reports that he does not drink alcohol or use drugs.  ROS:                                        Physical Exam: There were no vitals taken for this visit.  GU: No CVA  tenderness.  No bladder fullness or masses.  Patient with uncircumcised phallus. Foreskin easily retracted  Urethral meatus is patent.  No penile discharge. No penile lesions or rashes. Scrotum without lesions, cysts, rashes and/or edema.  Testicles are located scrotally bilaterally. No masses are appreciated in the testicles. Left and right epididymis are normal. Rectal: Patient with  normal sphincter tone. Anus and perineum without scarring or rashes. No rectal masses are appreciated. Prostate is approximately 90 grams (could not palpated entire gland due to buttocks tissue)  Seminal vesicles are normal.   Laboratory Data: Lab Results  Component Value Date   WBC 5.8 10/23/2013   HGB 13.0 10/23/2013   HCT 36.9 (L) 10/23/2013   MCV 88 10/23/2013   PLT 166 10/23/2013    Lab Results  Component Value Date   CREATININE 1.09 10/23/2013  PSA History:  7.9 ng/mL on 04/14/2014  8.3 ng/mL on 06/15/2014  3.9 ng/mL on 02/09/2015  1.9 ng/mL on 02/01/2016    Assessment & Plan:    1. BPH with LUTS  - IPSS score is 12/3, it is worsening  - Continue conservative management, avoiding bladder irritants and timed voiding's  - Change alpha-blocker to alfuzosin 10 mg daily; script given   - Continue finasteride 5 mg daily; refills given  - RTC in 6 months for IPSS and exam   No Follow-up on file.  Michiel Cowboy, PA-C  [facility] [address], [address] [place], [place] [address] [phone]

## 2017-01-23 ENCOUNTER — Ambulatory Visit: Payer: Medicare Other | Admitting: Urology

## 2017-02-05 NOTE — Progress Notes (Signed)
11:47 AM   Bradley Wolf Bradley Wolf [birth year] [id number]  Referring provider: Lauro Regulus, MD [address] [facility] - [place], [place] [address]  Chief Complaint  Patient presents with  . Follow-up    1 month BPH    HPI: Patient is an 83 year old Hispanic male who presents today with interpreter, Kandis Cocking, with a history of elevated PSA and BPH with LUTS who presents today for 2 week follow-up after a trial of Cialis 2.5 mg daily for a weak urinary stream.  BPH WITH LUTS His IPSS score today is 4, which is mild lower urinary tract symptomatology. He is pleased with his quality life due to his urinary symptoms.  His previous IPSS score was 12/3.  His major complaint today a weak and slow urinary stream at night.   He has had these symptoms for several years.  He denies any dysuria, hematuria or suprapubic pain.   He currently taking Cialis 2.5 mg trouches daily and finasteride 5 mg daily.  He feels the Cialis has been much more effective.  His insurance will not cover the Cialis and it is too expensive to buy out of pocket.  We will apply for PAP.  He also denies any recent fevers, chills, nausea or vomiting.  He does not have a family history of PCa.      IPSS    Row Name 12/12/16 1100 02/06/17 1100       International Prostate Symptom Score   How often have you had the sensation of not emptying your bladder? Less than 1 in 5 Not at All    How often have you had to urinate less than every two hours? Less than 1 in 5 times Not at All    How often have you found you stopped and started again several times when you urinated? Less than 1 in 5 times Less than 1 in 5 times    How often have you found it difficult to postpone urination? Less than 1 in 5 times Not at All    How often have you had a weak urinary stream? Almost always Not at All    How often have you had to strain to start urination? Less than 1 in 5 times Not at All    How many times did you  typically get up at night to urinate? 2 Times 3 Times    Total IPSS Score 12 4      Quality of Life due to urinary symptoms   If you were to spend the rest of your life with your urinary condition just the way it is now how would you feel about that? Mixed Pleased       Score:  1-7 Mild 8-19 Moderate 20-35 Severe   PMH: Past Medical History:  Diagnosis Date  . BPH (benign prostatic hyperplasia)   . CKD (chronic kidney disease)   . Elevated PSA   . Generalized weakness   . HTN (hypertension)   . Left groin pain   . Left hip pain     Surgical History: Past Surgical History:  Procedure Laterality Date  . LEG SURGERY  2004   due to accident   this is a knee surgery    Home Medications:  Allergies as of 02/06/2017   No Known Allergies     Medication List       Accurate as of 02/06/17 11:47 AM. Always use your most recent med list.  acetaminophen 325 MG tablet Commonly known as:  TYLENOL Take by mouth. Reported on 04/25/2016   alfuzosin 10 MG 24 hr tablet Commonly known as:  UROXATRAL Take 1 tablet (10 mg total) by mouth daily with breakfast.   azelastine 0.1 % nasal spray Commonly known as:  ASTELIN Place into the nose.   finasteride 5 MG tablet Commonly known as:  PROSCAR   fluticasone 50 MCG/ACT nasal spray Commonly known as:  FLONASE Place into the nose.   pantoprazole 40 MG tablet Commonly known as:  PROTONIX Take by mouth.   risperiDONE 0.25 MG tablet Commonly known as:  RISPERDAL Take 0.25 mg by mouth at bedtime.   senna 8.6 MG Tabs tablet Commonly known as:  SENOKOT Take 1 tablet by mouth.   tadalafil 5 MG tablet Commonly known as:  CIALIS Take 1 tablet (5 mg total) by mouth daily as needed for erectile dysfunction.   tamsulosin 0.4 MG Caps capsule Commonly known as:  FLOMAX Take 1 capsule (0.4 mg total) by mouth daily.       Allergies: No Known Allergies  Family History: Family History  Problem Relation Age of Onset  .  Benign prostatic hyperplasia Brother   . Prostate cancer Neg Hx   . Kidney disease Neg Hx   . Kidney cancer Neg Hx   . Bladder Cancer Neg Hx     Social History:  reports that he quit smoking about 53 years ago. He has never used smokeless tobacco. He reports that he does not drink alcohol or use drugs.  ROS: UROLOGY Frequent Urination?: No Hard to postpone urination?: No Burning/pain with urination?: No Get up at night to urinate?: Yes Leakage of urine?: No Urine stream starts and stops?: Yes Trouble starting stream?: No Do you have to strain to urinate?: No Blood in urine?: No Urinary tract infection?: No Sexually transmitted disease?: No Injury to kidneys or bladder?: No Painful intercourse?: No Weak stream?: Yes Erection problems?: No Penile pain?: No  Gastrointestinal Nausea?: No Vomiting?: No Indigestion/heartburn?: No Diarrhea?: No Constipation?: Yes  Constitutional Fever: No Night sweats?: No Weight loss?: No Fatigue?: No  Skin Skin rash/lesions?: No Itching?: No  Eyes Blurred vision?: No Double vision?: No  Ears/Nose/Throat Sore throat?: No Sinus problems?: No  Hematologic/Lymphatic Swollen glands?: No Easy bruising?: No  Cardiovascular Leg swelling?: No Chest pain?: No  Respiratory Cough?: No Shortness of breath?: No  Endocrine Excessive thirst?: Yes  Musculoskeletal Back pain?: No Joint pain?: No  Neurological Headaches?: No Dizziness?: No  Psychologic Depression?: No Anxiety?: No  Physical Exam: BP (!) 178/70   Pulse 76   Ht 5' 2.99" (1.6 m)   Wt 152 lb 3.2 oz (69 kg)   BMI 26.97 kg/m   Constitutional: Well nourished. Alert and oriented, No acute distress. HEENT:  AT, moist mucus membranes. Trachea midline, no masses. Cardiovascular: No clubbing, cyanosis, or edema. Respiratory: Normal respiratory effort, no increased work of breathing. Skin: No rashes, bruises or suspicious lesions. Lymph: No cervical or  inguinal adenopathy. Neurologic: Grossly intact, no focal deficits, moving all 4 extremities. Psychiatric: Normal mood and affect.    Laboratory Data: Lab Results  Component Value Date   WBC 5.8 10/23/2013   HGB 13.0 10/23/2013   HCT 36.9 (L) 10/23/2013   MCV 88 10/23/2013   PLT 166 10/23/2013    Lab Results  Component Value Date   CREATININE 1.09 10/23/2013  PSA History:  7.9 ng/mL on 04/14/2014  8.3 ng/mL on 06/15/2014  3.9 ng/mL on  02/09/2015  1.9 ng/mL on 02/01/2016    Assessment & Plan:    1. BPH with LUTS  - IPSS score is 4/1, it is improving  - Continue conservative management, avoiding bladder irritants and timed voiding's  - Cialis was effective - will apply for PAP for the Cialis, patient given the forms    - Continue finasteride 5 mg daily; refills given  - RTC in 1 months for IPSS and exam   Return in about 1 year (around 02/06/2018) for IPSS and PVR.  Michiel Cowboy, PA-C  [facility] [address], [address] [place], [place] [address] [phone]

## 2017-02-06 ENCOUNTER — Encounter: Payer: Self-pay | Admitting: Urology

## 2017-02-06 ENCOUNTER — Ambulatory Visit (INDEPENDENT_AMBULATORY_CARE_PROVIDER_SITE_OTHER): Payer: Medicare Other | Admitting: Urology

## 2017-02-06 VITALS — BP 178/70 | HR 76 | Ht 62.99 in | Wt 152.2 lb

## 2017-02-06 DIAGNOSIS — N400 Enlarged prostate without lower urinary tract symptoms: Secondary | ICD-10-CM

## 2017-02-06 DIAGNOSIS — N138 Other obstructive and reflux uropathy: Secondary | ICD-10-CM

## 2017-02-06 DIAGNOSIS — N401 Enlarged prostate with lower urinary tract symptoms: Secondary | ICD-10-CM

## 2017-02-06 LAB — BLADDER SCAN AMB NON-IMAGING: SCAN RESULT: 0

## 2017-02-06 MED ORDER — TADALAFIL 5 MG PO TABS: 5.0000 mg | ORAL_TABLET | Freq: Every day | ORAL | 3 refills | Status: DC | PRN

## 2017-05-01 ENCOUNTER — Other Ambulatory Visit: Payer: Self-pay | Admitting: Urology

## 2017-05-01 DIAGNOSIS — N401 Enlarged prostate with lower urinary tract symptoms: Secondary | ICD-10-CM

## 2017-05-01 NOTE — Telephone Encounter (Signed)
Pt's son called and stated his father needs refills to mail order pharmacy for prostate med.  The pharmacy stated they sent this 2 weeks ago, but haven't heard anything.  They will refax today 05/01/17.  This is a Health visitormail order Secretary/administrator([facility]).

## 2017-05-01 NOTE — Telephone Encounter (Signed)
Called pt's son Gerilyn NestleRamon who is [place] on [place]. Son says he does not know anything about the meds, his brother is who called. Son verbalized understanding. Advised not showing brother on [place]. Gerilyn NestleRamon says he will speak w/ his brother and return call tomorrow.

## 2017-05-06 MED ORDER — FINASTERIDE 5 MG PO TABS: 5.0000 mg | ORAL_TABLET | Freq: Every day | ORAL | 11 refills | Status: DC

## 2017-05-06 NOTE — Telephone Encounter (Signed)
Spoke with pt son. Made aware medication has been called into pharmacy. Son voiced understanding.

## 2017-05-06 NOTE — Telephone Encounter (Signed)
Pt's son, Mikle BosworthCarlos called and stated his Dad is out of Rx.  He hasn't taken in more than a week.  Son is very upset.  They would like to get refills ASAP.  Tadalafil 2.5  Please give son, Mikle BosworthCarlos a call [phone].

## 2017-05-07 ENCOUNTER — Other Ambulatory Visit: Payer: Self-pay | Admitting: Nurse Practitioner

## 2017-05-07 DIAGNOSIS — R413 Other amnesia: Secondary | ICD-10-CM

## 2017-05-21 ENCOUNTER — Ambulatory Visit
Admission: RE | Admit: 2017-05-21 | Discharge: 2017-05-21 | Disposition: A | Discharge status: Home / Self Care | Payer: Medicare Other | Source: Ambulatory Visit | Attending: Nurse Practitioner | Admitting: Nurse Practitioner

## 2017-05-21 DIAGNOSIS — G311 Senile degeneration of brain, not elsewhere classified: Secondary | ICD-10-CM | POA: Insufficient documentation

## 2017-05-21 DIAGNOSIS — R413 Other amnesia: Secondary | ICD-10-CM

## 2017-05-21 LAB — POCT I-STAT CREATININE: Creatinine, Ser: 1 mg/dL (ref 0.61–1.24)

## 2017-05-21 MED ORDER — GADOBENATE DIMEGLUMINE 529 MG/ML IV SOLN
14.0000 mL | Freq: Once | INTRAVENOUS | Status: AC | PRN
  Administered 2017-05-21: 14 mL via INTRAVENOUS

## 2018-02-05 ENCOUNTER — Ambulatory Visit: Payer: Medicare Other | Admitting: Urology

## 2018-07-07 ENCOUNTER — Encounter: Payer: Self-pay | Admitting: Urology

## 2018-07-07 ENCOUNTER — Ambulatory Visit (INDEPENDENT_AMBULATORY_CARE_PROVIDER_SITE_OTHER): Payer: Medicare Other | Admitting: Urology

## 2018-07-07 VITALS — BP 166/71 | HR 76 | Resp 16 | Ht 64.0 in | Wt 149.0 lb

## 2018-07-07 DIAGNOSIS — N138 Other obstructive and reflux uropathy: Secondary | ICD-10-CM | POA: Diagnosis not present

## 2018-07-07 DIAGNOSIS — N401 Enlarged prostate with lower urinary tract symptoms: Secondary | ICD-10-CM

## 2018-07-07 LAB — BLADDER SCAN AMB NON-IMAGING: SCAN RESULT: 11

## 2018-07-07 MED ORDER — TADALAFIL 5 MG PO TABS: 5.0000 mg | ORAL_TABLET | Freq: Every day | ORAL | 3 refills | Status: DC | PRN

## 2018-07-07 MED ORDER — TAMSULOSIN HCL 0.4 MG PO CAPS: 0.4000 mg | ORAL_CAPSULE | Freq: Every day | ORAL | 3 refills | Status: DC

## 2018-07-07 MED ORDER — FINASTERIDE 5 MG PO TABS: 5.0000 mg | ORAL_TABLET | Freq: Every day | ORAL | 3 refills | Status: DC

## 2018-07-07 NOTE — Progress Notes (Signed)
Interpreter NW#[id number]

## 2018-07-07 NOTE — Progress Notes (Signed)
1:25 PM   Bradley Wolf 12/21/1928 [id number]  Referring provider: Lauro Regulus, MD [address] [facility] [place], [place] [address]  No chief complaint on file.   HPI: Patient is an 84 year old Hispanic male who presents today with interpreter services with [facility] [phone] with a history of elevated PSA and BPH with LU TS.    BPH WITH LUTS His IPSS score today is 3, which is mild lower urinary tract symptomatology.  He is mostly dissatisfied with his quality life due to his urinary symptoms.  His PVR is 11 mL.  His previous IPSS score was 4/1.   He states he has been without his medication for 1 month.   IPSS    Row Name 07/07/18 1000         International Prostate Symptom Score   How often have you had the sensation of not emptying your bladder?  Not at All     How often have you had to urinate less than every two hours?  Not at All     How often have you found you stopped and started again several times when you urinated?  Not at All     How often have you found it difficult to postpone urination?  Not at All     How often have you had a weak urinary stream?  Not at All     How often have you had to strain to start urination?  Not at All     How many times did you typically get up at night to urinate?  3 Times     Total IPSS Score  3       Quality of Life due to urinary symptoms   If you were to spend the rest of your life with your urinary condition just the way it is now how would you feel about that?  Mostly Disatisfied        Score:  1-7 Mild 8-19 Moderate 20-35 Severe   PMH: Past Medical History:  Diagnosis Date  . BPH (benign prostatic hyperplasia)   . CKD (chronic kidney disease)   . Elevated PSA   . Generalized weakness   . HTN (hypertension)   . Left groin pain   . Left hip pain     Surgical History: Past Surgical History:  Procedure Laterality Date  . LEG SURGERY  2004   due to accident   this is a knee  surgery    Home Medications:  Allergies as of 07/07/2018   No Known Allergies     Medication List        Accurate as of 07/07/18 11:59 PM. Always use your most recent med list.          acetaminophen 325 MG tablet Commonly known as:  TYLENOL Take by mouth. Reported on 04/25/2016   alfuzosin 10 MG 24 hr tablet Commonly known as:  UROXATRAL Take 1 tablet (10 mg total) by mouth daily with breakfast.   azelastine 0.1 % nasal spray Commonly known as:  ASTELIN Place into the nose.   finasteride 5 MG tablet Commonly known as:  PROSCAR Take 1 tablet (5 mg total) by mouth daily.   fluticasone 50 MCG/ACT nasal spray Commonly known as:  FLONASE Place into the nose.   pantoprazole 40 MG tablet Commonly known as:  PROTONIX Take by mouth.   risperiDONE 0.25 MG tablet Commonly known as:  RISPERDAL Take 0.25 mg by mouth  at bedtime.   senna 8.6 MG Tabs tablet Commonly known as:  SENOKOT Take 1 tablet by mouth.   tadalafil 5 MG tablet Commonly known as:  CIALIS Take 1 tablet (5 mg total) by mouth daily as needed for erectile dysfunction.   tamsulosin 0.4 MG Caps capsule Commonly known as:  FLOMAX Take 1 capsule (0.4 mg total) by mouth daily.       Allergies: No Known Allergies  Family History: Family History  Problem Relation Age of Onset  . Benign prostatic hyperplasia Brother   . Prostate cancer Neg Hx   . Kidney disease Neg Hx   . Kidney cancer Neg Hx   . Bladder Cancer Neg Hx     Social History:  reports that he quit smoking about 54 years ago. He has never used smokeless tobacco. He reports that he does not drink alcohol or use drugs.  ROS: UROLOGY Frequent Urination?: No Hard to postpone urination?: No Burning/pain with urination?: No Get up at night to urinate?: No Leakage of urine?: No Urine stream starts and stops?: No Trouble starting stream?: No Do you have to strain to urinate?: No Blood in urine?: No Urinary tract infection?: No Sexually  transmitted disease?: No Injury to kidneys or bladder?: No Weak stream?: No Erection problems?: No Penile pain?: No  Gastrointestinal Nausea?: No Vomiting?: No Indigestion/heartburn?: No Diarrhea?: No Constipation?: No  Constitutional Fever: No Night sweats?: No Weight loss?: No Fatigue?: No  Skin Skin rash/lesions?: No Itching?: No  Eyes Blurred vision?: No Double vision?: No  Ears/Nose/Throat Sore throat?: No Sinus problems?: No  Hematologic/Lymphatic Swollen glands?: No Easy bruising?: No  Cardiovascular Chest pain?: No  Respiratory Cough?: No Shortness of breath?: No  Endocrine Excessive thirst?: No  Musculoskeletal Back pain?: No Joint pain?: No  Neurological Headaches?: No Dizziness?: No  Psychologic Depression?: No Anxiety?: No  Physical Exam: BP (!) 166/71   Pulse 76   Resp 16   Ht 5\' 4"  (1.626 m)   Wt 149 lb (67.6 kg)   BMI 25.58 kg/m   Constitutional: Well nourished. Alert and oriented, No acute distress. HEENT: [place] AT, moist mucus membranes. Trachea midline, no masses. Cardiovascular: No clubbing, cyanosis, or edema. Respiratory: Normal respiratory effort, no increased work of breathing. GI: Abdomen is soft, non tender, non distended, no abdominal masses. Liver and spleen not palpable.  No hernias appreciated.  Stool sample for occult testing is not indicated.   GU: No CVA tenderness.  No bladder fullness or masses.  Patient with uncircumcised phallus.  Foreskin easily retracted   Urethral meatus is patent.  No penile discharge. No penile lesions or rashes. Scrotum without lesions, cysts, rashes and/or edema.  Testicles are located scrotally bilaterally. No masses are appreciated in the testicles. Left and right epididymis are normal. Rectal: Patient with  normal sphincter tone. Anus and perineum without scarring or rashes. No rectal masses are appreciated. Prostate is approximately 60 + grams and could only palpate the apex of the  prostate.   Skin: No rashes, bruises or suspicious lesions. Lymph: No cervical or inguinal adenopathy. Neurologic: Grossly intact, no focal deficits, moving all 4 extremities. Psychiatric: Normal mood and affect.    Laboratory Data: Lab Results  Component Value Date   WBC 5.8 10/23/2013   HGB 13.0 10/23/2013   HCT 36.9 (L) 10/23/2013   MCV 88 10/23/2013   PLT 166 10/23/2013    Lab Results  Component Value Date   CREATININE 1.00 05/21/2017  PSA History:  7.9 ng/mL  on 04/14/2014  8.3 ng/mL on 06/15/2014  3.9 ng/mL on 02/09/2015  1.9 ng/mL on 02/01/2016 I have reviewed the labs.   Assessment & Plan:    1. BPH with LUTS IPSS score is 3/4, it is worsening Continue conservative management, avoiding bladder irritants and timed voiding's Patient prescribed tadalafil 5 mg daily; he will call if it is cost prohibitive   Continue finasteride 5 mg daily; refills given RTC in 12 months for IPSS and exam   Return in about 1 year (around 07/08/2019) for IPSS and PVR.  Michiel Cowboy, PA-C  [facility] [address] [place], [place] [address] [phone]

## 2018-08-19 ENCOUNTER — Emergency Department: Payer: Medicare Other

## 2018-08-19 ENCOUNTER — Emergency Department
Admission: EM | Admit: 2018-08-19 | Discharge: 2018-08-19 | Disposition: A | Discharge status: Home / Self Care | Payer: Medicare Other | Attending: Emergency Medicine | Admitting: Emergency Medicine

## 2018-08-19 ENCOUNTER — Other Ambulatory Visit: Payer: Self-pay

## 2018-08-19 DIAGNOSIS — J181 Lobar pneumonia, unspecified organism: Secondary | ICD-10-CM | POA: Insufficient documentation

## 2018-08-19 DIAGNOSIS — Z87891 Personal history of nicotine dependence: Secondary | ICD-10-CM | POA: Insufficient documentation

## 2018-08-19 DIAGNOSIS — I129 Hypertensive chronic kidney disease with stage 1 through stage 4 chronic kidney disease, or unspecified chronic kidney disease: Secondary | ICD-10-CM | POA: Diagnosis not present

## 2018-08-19 DIAGNOSIS — R05 Cough: Secondary | ICD-10-CM | POA: Diagnosis present

## 2018-08-19 DIAGNOSIS — N189 Chronic kidney disease, unspecified: Secondary | ICD-10-CM | POA: Diagnosis not present

## 2018-08-19 DIAGNOSIS — Z79899 Other long term (current) drug therapy: Secondary | ICD-10-CM | POA: Diagnosis not present

## 2018-08-19 DIAGNOSIS — J189 Pneumonia, unspecified organism: Secondary | ICD-10-CM

## 2018-08-19 LAB — BASIC METABOLIC PANEL
Anion gap: 10 (ref 5–15)
BUN: 18 mg/dL (ref 8–23)
CHLORIDE: 101 mmol/L (ref 98–111)
CO2: 24 mmol/L (ref 22–32)
CREATININE: 1 mg/dL (ref 0.61–1.24)
Calcium: 9.3 mg/dL (ref 8.9–10.3)
Glucose, Bld: 126 mg/dL — ABNORMAL HIGH (ref 70–99)
Potassium: 3.9 mmol/L (ref 3.5–5.1)
SODIUM: 135 mmol/L (ref 135–145)

## 2018-08-19 LAB — CBC WITH DIFFERENTIAL/PLATELET
Abs Immature Granulocytes: 0.1 10*3/uL — ABNORMAL HIGH (ref 0.00–0.07)
BASOS ABS: 0.1 10*3/uL (ref 0.0–0.1)
BASOS PCT: 0 %
EOS PCT: 1 %
Eosinophils Absolute: 0.1 10*3/uL (ref 0.0–0.5)
HCT: 38.1 % — ABNORMAL LOW (ref 39.0–52.0)
HEMOGLOBIN: 13.5 g/dL (ref 13.0–17.0)
Immature Granulocytes: 1 %
LYMPHS PCT: 10 %
Lymphs Abs: 1.7 10*3/uL (ref 0.7–4.0)
MCH: 29.9 pg (ref 26.0–34.0)
MCHC: 35.4 g/dL (ref 30.0–36.0)
MCV: 84.5 fL (ref 80.0–100.0)
MONO ABS: 1 10*3/uL (ref 0.1–1.0)
Monocytes Relative: 6 %
NRBC: 0 % (ref 0.0–0.2)
Neutro Abs: 13.7 10*3/uL — ABNORMAL HIGH (ref 1.7–7.7)
Neutrophils Relative %: 82 %
PLATELETS: 269 10*3/uL (ref 150–400)
RBC: 4.51 MIL/uL (ref 4.22–5.81)
RDW: 12.9 % (ref 11.5–15.5)
WBC: 16.6 10*3/uL — AB (ref 4.0–10.5)

## 2018-08-19 LAB — TROPONIN I

## 2018-08-19 MED ORDER — AZITHROMYCIN 250 MG PO TABS: ORAL_TABLET | ORAL | 0 refills | Status: DC

## 2018-08-19 MED ORDER — BENZONATATE 100 MG PO CAPS
100.0000 mg | ORAL_CAPSULE | Freq: Once | ORAL | Status: AC
  Administered 2018-08-19: 100 mg via ORAL
  Filled 2018-08-19: qty 1

## 2018-08-19 MED ORDER — BENZONATATE 100 MG PO CAPS: 100.0000 mg | ORAL_CAPSULE | Freq: Three times a day (TID) | ORAL | 0 refills | Status: DC | PRN

## 2018-08-19 MED ORDER — SODIUM CHLORIDE 0.9 % IV SOLN
1.0000 g | Freq: Once | INTRAVENOUS | Status: AC
  Administered 2018-08-19: 1 g via INTRAVENOUS
  Filled 2018-08-19: qty 10

## 2018-08-19 MED ORDER — CEFTRIAXONE SODIUM 1 G IJ SOLR: 1.0000 g | Freq: Once | INTRAMUSCULAR | Status: DC

## 2018-08-19 MED ORDER — AZITHROMYCIN 500 MG PO TABS
500.0000 mg | ORAL_TABLET | Freq: Once | ORAL | Status: AC
  Administered 2018-08-19: 500 mg via ORAL
  Filled 2018-08-19: qty 1

## 2018-08-19 NOTE — ED Provider Notes (Signed)
[facility] Emergency Department Provider Note ____________________________________________   I have reviewed the triage vital signs and the triage nursing note.  HISTORY  Chief Complaint Cough   Historian Patient Through Spanish interpreter  HPI Bradley Wolf is a 84 y.o. male with a history of BPH, hypertension, presents to the ER today with cough, reports of green sputum, and states the cough started about 12 days ago.  Denies chest pain.  States that there does seem to be some drainage at the back of his throat.  Sounds like he has used an inhaler in the past.  Denies chest pain.  Denies indigestion.  Patient went to urgent care yesterday and was prescribed prednisone taper as well as doxycycline.  Patient states he took the first dose of the doxazosin was last night.  He states that within about 10 minutes or so he felt like he was having chills and was nauseated and felt like he was shaking.  He states that he thinks he is allergic or not tolerant of that medication.  He had not taken the prednisone.  Denies any true fever.  No abdominal pain.  No diarrhea.   Past Medical History:  Diagnosis Date  . BPH (benign prostatic hyperplasia)   . CKD (chronic kidney disease)   . Elevated PSA   . Generalized weakness   . HTN (hypertension)   . Left groin pain   . Left hip pain     Patient Active Problem List   Diagnosis Date Noted  . BPH with obstruction/lower urinary tract symptoms 08/10/2015  . Elevated PSA 08/10/2015    Past Surgical History:  Procedure Laterality Date  . LEG SURGERY  2004   due to accident   this is a knee surgery    Prior to Admission medications   Medication Sig Start Date End Date Taking? Authorizing Provider  acetaminophen (TYLENOL) 325 MG tablet Take by mouth. Reported on 04/25/2016    [provider]  alfuzosin (UROXATRAL) 10 MG 24 hr tablet Take 1 tablet (10 mg total) by mouth daily with breakfast.  12/12/16   Marvel PlanMcGowan, Carollee HerterShannon A, PA-C  azelastine (ASTELIN) 0.1 % nasal spray Place into the nose. 11/28/15 11/27/16  [provider]  azithromycin (ZITHROMAX) 250 MG tablet 1 tablet daily for 4 more days 08/19/18   Governor RooksLord, Edrie Ehrich, MD  benzonatate (TESSALON PERLES) 100 MG capsule Take 1 capsule (100 mg total) by mouth 3 (three) times daily as needed for cough. 08/19/18   Governor RooksLord, Olivianna Higley, MD  finasteride (PROSCAR) 5 MG tablet Take 1 tablet (5 mg total) by mouth daily. 07/07/18   McGowan, Carollee HerterShannon A, PA-C  fluticasone (FLONASE) 50 MCG/ACT nasal spray Place into the nose.    [provider]  pantoprazole (PROTONIX) 40 MG tablet Take by mouth. 04/18/16 04/18/17  [provider]  risperiDONE (RISPERDAL) 0.25 MG tablet Take 0.25 mg by mouth at bedtime.    [provider]  senna (SENOKOT) 8.6 MG TABS tablet Take 1 tablet by mouth.    [provider]  tadalafil (CIALIS) 5 MG tablet Take 1 tablet (5 mg total) by mouth daily as needed for erectile dysfunction. 07/07/18   Michiel CowboyMcGowan, Shannon A, PA-C  tamsulosin (FLOMAX) 0.4 MG CAPS capsule Take 1 capsule (0.4 mg total) by mouth daily. 07/07/18   Michiel CowboyMcGowan, Shannon A, PA-C    No Known Allergies  Family History  Problem Relation Age of Onset  . Benign prostatic hyperplasia Brother   . Prostate cancer Neg  Hx   . Kidney disease Neg Hx   . Kidney cancer Neg Hx   . Bladder Cancer Neg Hx     Social History Social History   Tobacco Use  . Smoking status: Former Smoker    Last attempt to quit: 1965    Years since quitting: 54.9  . Smokeless tobacco: Never Used  Substance Use Topics  . Alcohol use: No    Alcohol/week: 0.0 standard drinks  . Drug use: No    Review of Systems  Constitutional: Subjective chills. Eyes: Negative for visual changes. ENT: Negative for sore throat. Cardiovascular: Negative for chest pain. Respiratory: Positive for cough some green sputum production.   Gastrointestinal: Negative for abdominal  pain, vomiting and diarrhea. Genitourinary: Negative for dysuria. Musculoskeletal: Negative for back pain. Skin: Negative for rash. Neurological: Negative for headache.  ____________________________________________   PHYSICAL EXAM:  VITAL SIGNS: ED Triage Vitals  Enc Vitals Group     BP 08/19/18 0754 (!) 165/66     Pulse Rate 08/19/18 0754 97     Resp 08/19/18 0754 18     Temp 08/19/18 0754 97.7 F (36.5 C)     Temp Source 08/19/18 0754 Oral     SpO2 08/19/18 0754 96 %     Weight 08/19/18 0757 154 lb (69.9 kg)     Height 08/19/18 0757 5\' 3"  (1.6 m)     Head Circumference --      Peak Flow --      Pain Score 08/19/18 0756 3     Pain Loc --      Pain Edu? --      Excl. in GC? --      Constitutional: Alert and oriented.  HEENT      Head: Normocephalic and atraumatic.      Eyes: Conjunctivae are normal. Pupils equal and round.       Ears:         Nose: No congestion/rhinnorhea.      Mouth/Throat: Mucous membranes are moist.      Neck: No stridor. Cardiovascular/Chest: Normal rate, regular rhythm.  No murmurs, rubs, or gallops. Respiratory: Normal respiratory effort without tachypnea nor retractions. Breath sounds are clear and equal bilaterally.  Mild rhonchi at the bases without any wheezing. Gastrointestinal: Soft. No distention, no guarding, no rebound. Nontender.    Genitourinary/rectal:Deferred Musculoskeletal: Nontender with normal range of motion in all extremities. No joint effusions.  No lower extremity tenderness.  No edema. Neurologic:  Normal speech and language. No gross or focal neurologic deficits are appreciated. Skin:  Skin is warm, dry and intact. No rash noted. Psychiatric: Mood and affect are normal. Speech and behavior are normal. Patient exhibits appropriate insight and judgment.   ____________________________________________  LABS (pertinent positives/negatives) I, Governor Rooks, MD the attending physician have reviewed the labs noted  below.  Labs Reviewed  BASIC METABOLIC PANEL - Abnormal; Notable for the following components:      Result Value   Glucose, Bld 126 (*)    All other components within normal limits  CBC WITH DIFFERENTIAL/PLATELET - Abnormal; Notable for the following components:   WBC 16.6 (*)    HCT 38.1 (*)    Neutro Abs 13.7 (*)    Abs Immature Granulocytes 0.10 (*)    All other components within normal limits  TROPONIN I    ____________________________________________    EKG I, Governor Rooks, MD, the attending physician have personally viewed and interpreted all ECGs.  78 bpm.  Normal sinus rhythm.  Right bundle branch block.  Nonspecific T wave ____________________________________________  RADIOLOGY   Chest x-ray two-view, viewed by myself and interval by the radiologist:  IMPRESSION: Mild right basilar atelectasis or infiltrate. __________________________________________  PROCEDURES  Procedure(s) performed: None  Procedures  Critical Care performed: None   ____________________________________________  ED COURSE / ASSESSMENT AND PLAN  Pertinent labs & imaging results that were available during my care of the patient were reviewed by me and considered in my medical decision making (see chart for details).     Patient is overall well-appearing with no fever, no hypoxia, no tachycardia or hypertension.  It sounds like he is having more of a bronchitis based on the cough pointing to his throat and having some nasal drainage.  His lungs do have some rhonchi posteriorly, however there is no wheezing.  Initially it sounded like he might have used inhaler before, but I asked him again he has not.  For this reason I am going to discontinue the prednisone taper as I do not think that he is having wheezing.  He states that he did not tolerate the doxycycline I will go ahead and discontinue this.  It does not sound like there is a clear allergic reaction, however we will go ahead and  discontinue it.  X-ray shows right basilar atelectasis versus infiltrate and I am to cover for committee acquired pneumonia with 1 dose of Rocephin IV here prior to discharge 500 mg azithromycin and then followed by azithromycin at home for the next 5 days.  He does have elevated white blood cell count.  Again I do not think that he needs hospitalization with stable vital signs and stable clinically.  Walking O2 sat okay.    CONSULTATIONS:   None   Patient / Family / Caregiver informed of clinical course, medical decision-making process, and agree with plan.   I discussed return precautions, follow-up instructions, and discharge instructions with patient and/or family.  Discharge Instructions : Return to the emergency department for any worsening condition or trouble breathing, chest pain, or altered mental status, or any symptoms concerning to you.    ___________________________________________   FINAL CLINICAL IMPRESSION(S) / ED DIAGNOSES   Final diagnoses:  Community acquired pneumonia of right lower lobe of lung (HCC)      ___________________________________________         Note: This dictation was prepared with Dragon dictation. Any transcriptional errors that result from this process are unintentional    Governor Rooks, MD 08/19/18 1304

## 2018-08-19 NOTE — ED Notes (Signed)
NAD noted at time of D/C. Pt denies questions or concerns. Pt ambulatory to the lobby at this time. Jacqui, Interpreter at bedside to review D/C instructions at this time.

## 2018-08-19 NOTE — ED Notes (Signed)
This RN to bedside, to apologized to patient for delay. Pt noted to be on cellphone. NAD noted at this time.

## 2018-08-19 NOTE — Discharge Instructions (Signed)
Return to the emergency department for any worsening condition or trouble breathing, chest pain, or altered mental status, or any symptoms concerning to you.

## 2018-08-19 NOTE — ED Notes (Signed)
Pt presents to ED via POV with c/o cough x 12 days with beige colored phlegm. Pt was prescribed prednisone and abx by PCP. Pt states took 1 doxycycline. Pt states took 1 dose of Doxy and last night pt c/o chills, fever, and continued cough. Pt states did not check a temperature last night but states he "felt warm". Pt is afebrile on arrival to ED.

## 2018-08-19 NOTE — ED Triage Notes (Addendum)
Pt c/o cough with green colored sputum for the past 12 days. Pt is in NAD, respirations WNL. Pt ambulatory to triage without distress .Marland Kitchen.pt was seen by his PCP yesterday and Rx abx and prednisone and took one dose and is not helping. States he felt bad after taking one dose of the medication last night.

## 2018-08-21 ENCOUNTER — Other Ambulatory Visit: Payer: Self-pay

## 2018-08-21 ENCOUNTER — Emergency Department: Payer: Medicare Other

## 2018-08-21 ENCOUNTER — Emergency Department
Admission: EM | Admit: 2018-08-21 | Discharge: 2018-08-21 | Disposition: A | Discharge status: Home / Self Care | Payer: Medicare Other | Attending: Emergency Medicine | Admitting: Emergency Medicine

## 2018-08-21 ENCOUNTER — Encounter: Payer: Self-pay | Admitting: Emergency Medicine

## 2018-08-21 DIAGNOSIS — Z87891 Personal history of nicotine dependence: Secondary | ICD-10-CM | POA: Insufficient documentation

## 2018-08-21 DIAGNOSIS — I129 Hypertensive chronic kidney disease with stage 1 through stage 4 chronic kidney disease, or unspecified chronic kidney disease: Secondary | ICD-10-CM | POA: Diagnosis not present

## 2018-08-21 DIAGNOSIS — R197 Diarrhea, unspecified: Secondary | ICD-10-CM | POA: Insufficient documentation

## 2018-08-21 DIAGNOSIS — N189 Chronic kidney disease, unspecified: Secondary | ICD-10-CM | POA: Diagnosis not present

## 2018-08-21 DIAGNOSIS — R109 Unspecified abdominal pain: Secondary | ICD-10-CM | POA: Diagnosis present

## 2018-08-21 DIAGNOSIS — R1010 Upper abdominal pain, unspecified: Secondary | ICD-10-CM | POA: Insufficient documentation

## 2018-08-21 LAB — URINALYSIS, COMPLETE (UACMP) WITH MICROSCOPIC
BILIRUBIN URINE: NEGATIVE
Bacteria, UA: NONE SEEN
Glucose, UA: NEGATIVE mg/dL
KETONES UR: NEGATIVE mg/dL
LEUKOCYTES UA: NEGATIVE
NITRITE: NEGATIVE
Protein, ur: NEGATIVE mg/dL
Specific Gravity, Urine: 1.011 (ref 1.005–1.030)
pH: 6 (ref 5.0–8.0)

## 2018-08-21 LAB — COMPREHENSIVE METABOLIC PANEL
ALBUMIN: 4.2 g/dL (ref 3.5–5.0)
ALT: 23 U/L (ref 0–44)
AST: 23 U/L (ref 15–41)
Alkaline Phosphatase: 81 U/L (ref 38–126)
Anion gap: 10 (ref 5–15)
BUN: 20 mg/dL (ref 8–23)
CO2: 24 mmol/L (ref 22–32)
CREATININE: 0.94 mg/dL (ref 0.61–1.24)
Calcium: 9.2 mg/dL (ref 8.9–10.3)
Chloride: 102 mmol/L (ref 98–111)
GFR calc Af Amer: >60 mL/min (ref >60)
GFR calc non Af Amer: >60 mL/min (ref >60)
Glucose, Bld: 122 mg/dL — ABNORMAL HIGH (ref 70–99)
POTASSIUM: 3.8 mmol/L (ref 3.5–5.1)
Sodium: 136 mmol/L (ref 135–145)
TOTAL PROTEIN: 8.1 g/dL (ref 6.5–8.1)
Total Bilirubin: 0.6 mg/dL (ref 0.3–1.2)

## 2018-08-21 LAB — CBC
HEMATOCRIT: 38.9 % — AB (ref 39.0–52.0)
Hemoglobin: 14 g/dL (ref 13.0–17.0)
MCH: 31.4 pg (ref 26.0–34.0)
MCHC: 36 g/dL (ref 30.0–36.0)
MCV: 87.2 fL (ref 80.0–100.0)
Platelets: 320 10*3/uL (ref 150–400)
RBC: 4.46 MIL/uL (ref 4.22–5.81)
RDW: 13.1 % (ref 11.5–15.5)
WBC: 9.2 10*3/uL (ref 4.0–10.5)
nRBC: 0 % (ref 0.0–0.2)

## 2018-08-21 LAB — LIPASE, BLOOD: LIPASE: 52 U/L — AB (ref 11–51)

## 2018-08-21 MED ORDER — UP4 PROBIOTICS ULTRA PO CAPS: 1.0000 | ORAL_CAPSULE | Freq: Two times a day (BID) | ORAL | 1 refills | Status: DC

## 2018-08-21 MED ORDER — FAMOTIDINE 20 MG PO TABS: 20.0000 mg | ORAL_TABLET | Freq: Two times a day (BID) | ORAL | 1 refills | Status: DC

## 2018-08-21 NOTE — ED Provider Notes (Signed)
[facility] Emergency Department Provider Note       Time seen: ----------------------------------------- 6:36 PM on 08/21/2018 -----------------------------------------   I have reviewed the triage vital signs and the nursing notes.  HISTORY   Chief Complaint Abdominal Pain    HPI Moody BruinsJose R Campos Wolf is a 84 y.o. male with a history of CKD, weakness, hypertension who presents to the ED for dental pain and diarrhea today.  Patient arrives from home with family.  He was recently seen and prescribed azithromycin for possible pneumonia.  Patient reports cough has improved somewhat.  Past Medical History:  Diagnosis Date  . BPH (benign prostatic hyperplasia)   . CKD (chronic kidney disease)   . Elevated PSA   . Generalized weakness   . HTN (hypertension)   . Left groin pain   . Left hip pain     Patient Active Problem List   Diagnosis Date Noted  . BPH with obstruction/lower urinary tract symptoms 08/10/2015  . Elevated PSA 08/10/2015    Past Surgical History:  Procedure Laterality Date  . LEG SURGERY  2004   due to accident   this is a knee surgery    Allergies Patient has no known allergies.  Social History Social History   Tobacco Use  . Smoking status: Former Smoker    Last attempt to quit: 1965    Years since quitting: 54.9  . Smokeless tobacco: Never Used  Substance Use Topics  . Alcohol use: No    Alcohol/week: 0.0 standard drinks  . Drug use: No   Review of Systems Constitutional: Negative for fever. Cardiovascular: Negative for chest pain. Respiratory: Negative for shortness of breath.  Positive for cough Gastrointestinal: Positive for abdominal pain, diarrhea Musculoskeletal: Negative for back pain. Skin: Negative for rash. Neurological: Negative for headaches, focal weakness or numbness.  All systems negative/normal/unremarkable except as stated in the HPI  ____________________________________________   PHYSICAL  EXAM:  VITAL SIGNS: ED Triage Vitals  Enc Vitals Group     BP 08/21/18 1733 (!) 173/64     Pulse Rate 08/21/18 1733 91     Resp 08/21/18 1733 18     Temp 08/21/18 1733 98 F (36.7 C)     Temp Source 08/21/18 1733 Oral     SpO2 08/21/18 1733 97 %     Weight 08/21/18 1734 154 lb (69.9 kg)     Height 08/21/18 1734 5\' 3"  (1.6 m)     Head Circumference --      Peak Flow --      Pain Score 08/21/18 1748 0     Pain Loc --      Pain Edu? --      Excl. in GC? --    Constitutional: Alert and oriented. Well appearing and in no distress. Eyes: Conjunctivae are normal. Normal extraocular movements. ENT   Head: Normocephalic and atraumatic.   Nose: No congestion/rhinnorhea.   Mouth/Throat: Mucous membranes are moist.   Neck: No stridor. Cardiovascular: Normal rate, regular rhythm. No murmurs, rubs, or gallops. Respiratory: Normal respiratory effort without tachypnea nor retractions. Breath sounds are clear and equal bilaterally. No wheezes/rales/rhonchi. Gastrointestinal: Soft and nontender. Normal bowel sounds Musculoskeletal: Nontender with normal range of motion in extremities. No lower extremity tenderness nor edema. Neurologic:  Normal speech and language. No gross focal neurologic deficits are appreciated.  Skin:  Skin is warm, dry and intact. No rash noted. Psychiatric: Mood and affect are normal. Speech and behavior are normal.  ____________________________________________  ED  COURSE:  As part of my medical decision making, I reviewed the following data within the electronic MEDICAL RECORD NUMBER History obtained from family if available, nursing notes, old chart and ekg, as well as notes from prior ED visits. Patient presented for abdominal pain and diarrhea, we will assess with labs and imaging as indicated at this time.   Procedures ____________________________________________   LABS (pertinent positives/negatives)  Labs Reviewed  LIPASE, BLOOD - Abnormal; Notable  for the following components:      Result Value   Lipase 52 (*)    All other components within normal limits  COMPREHENSIVE METABOLIC PANEL - Abnormal; Notable for the following components:   Glucose, Bld 122 (*)    All other components within normal limits  CBC - Abnormal; Notable for the following components:   HCT 38.9 (*)    All other components within normal limits  URINALYSIS, COMPLETE (UACMP) WITH MICROSCOPIC - Abnormal; Notable for the following components:   Color, Urine STRAW (*)    APPearance CLEAR (*)    Hgb urine dipstick SMALL (*)    All other components within normal limits    RADIOLOGY Images were viewed by me Acute abdominal series IMPRESSION: 1. No acute cardiopulmonary findings. 2. No evidence for bowel perforation or obstruction.  ____________________________________________  DIFFERENTIAL DIAGNOSIS   Dehydration, electrolyte abnormality, gastroenteritis, medication side effect, C. difficile colitis  FINAL ASSESSMENT AND PLAN  Abdominal pain, diarrhea   Plan: The patient had presented for abdominal pain and diarrhea. Patient's labs did not reveal any acute process, showed resolving leukocytosis. Patient's imaging was negative.  No evidence of pneumonia at this time.  I have advised that he stop taking any further antibiotics, switch to probiotics as well as taking Pepcid.  He is cleared for outpatient follow-up.   Ulice Dash, MD   Note: This note was generated in part or whole with voice recognition software. Voice recognition is usually quite accurate but there are transcription errors that can and very often do occur. I apologize for any typographical errors that were not detected and corrected.     Emily Filbert, MD 08/21/18 [phone]

## 2018-08-21 NOTE — ED Triage Notes (Signed)
Pt to ED from home with family c/o abd pain, diarrhea x2 today.  Pt recently seen and prescribed azithromycin and benzonatate, states cough had become slightly better.  Pt chest rise even and unlabored in triage, no cough present at this time.

## 2018-08-21 NOTE — ED Notes (Signed)
FIRST NURSE NOTE:  Pt here with family reports pt was seen here on Tuesday for cough, prescribed antibiotics and thinks he is having a reaction. Pt has been having an upset stomach and continues to cough.   Pt requesting interpreter.

## 2018-08-21 NOTE — ED Notes (Signed)
Pt reports he was taking antibiotics for URI this week and started to have episodes of loose stools, reports no appetite had episode of cough for 2 hrs this morning, taking Azithromycin 250mg , benzonante 100mg 

## 2018-09-10 ENCOUNTER — Other Ambulatory Visit: Payer: Self-pay | Admitting: Physician Assistant

## 2018-09-10 DIAGNOSIS — R1013 Epigastric pain: Secondary | ICD-10-CM

## 2018-09-15 ENCOUNTER — Ambulatory Visit
Admission: RE | Admit: 2018-09-15 | Discharge: 2018-09-15 | Disposition: A | Discharge status: Home / Self Care | Payer: Medicare Other | Source: Ambulatory Visit | Attending: Physician Assistant | Admitting: Physician Assistant

## 2018-09-15 DIAGNOSIS — R1013 Epigastric pain: Secondary | ICD-10-CM | POA: Diagnosis present

## 2018-09-15 DIAGNOSIS — K760 Fatty (change of) liver, not elsewhere classified: Secondary | ICD-10-CM | POA: Insufficient documentation

## 2018-09-24 ENCOUNTER — Other Ambulatory Visit: Payer: Self-pay | Admitting: Gastroenterology

## 2018-09-24 DIAGNOSIS — R1013 Epigastric pain: Secondary | ICD-10-CM

## 2018-09-24 DIAGNOSIS — Z8619 Personal history of other infectious and parasitic diseases: Secondary | ICD-10-CM

## 2018-10-02 ENCOUNTER — Ambulatory Visit
Admission: RE | Admit: 2018-10-02 | Discharge: 2018-10-02 | Disposition: A | Discharge status: Home / Self Care | Payer: Medicare Other | Source: Ambulatory Visit | Attending: Gastroenterology | Admitting: Gastroenterology

## 2018-10-02 DIAGNOSIS — R1013 Epigastric pain: Secondary | ICD-10-CM | POA: Diagnosis not present

## 2018-10-02 DIAGNOSIS — Z8619 Personal history of other infectious and parasitic diseases: Secondary | ICD-10-CM | POA: Diagnosis present

## 2019-04-15 ENCOUNTER — Ambulatory Visit
Admission: RE | Admit: 2019-04-15 | Discharge: 2019-04-15 | Disposition: A | Discharge status: Home / Self Care | Payer: Medicare Other | Source: Ambulatory Visit | Attending: Internal Medicine | Admitting: Internal Medicine

## 2019-04-15 ENCOUNTER — Other Ambulatory Visit: Payer: Self-pay

## 2019-04-15 ENCOUNTER — Other Ambulatory Visit: Payer: Self-pay | Admitting: Internal Medicine

## 2019-04-15 DIAGNOSIS — R109 Unspecified abdominal pain: Secondary | ICD-10-CM | POA: Insufficient documentation

## 2019-04-15 LAB — POCT I-STAT CREATININE: Creatinine, Ser: 1 mg/dL (ref 0.61–1.24)

## 2019-04-15 MED ORDER — IOHEXOL 300 MG/ML  SOLN
100.0000 mL | Freq: Once | INTRAMUSCULAR | Status: AC | PRN
  Administered 2019-04-15: 100 mL via INTRAVENOUS

## 2019-05-06 ENCOUNTER — Other Ambulatory Visit: Payer: Self-pay | Admitting: Gastroenterology

## 2019-05-06 DIAGNOSIS — R1013 Epigastric pain: Secondary | ICD-10-CM

## 2019-05-06 DIAGNOSIS — K7460 Unspecified cirrhosis of liver: Secondary | ICD-10-CM

## 2019-05-14 ENCOUNTER — Other Ambulatory Visit: Payer: Self-pay

## 2019-05-14 ENCOUNTER — Ambulatory Visit
Admission: RE | Admit: 2019-05-14 | Discharge: 2019-05-14 | Disposition: A | Discharge status: Home / Self Care | Payer: Medicare Other | Source: Ambulatory Visit | Attending: Gastroenterology | Admitting: Gastroenterology

## 2019-05-14 DIAGNOSIS — K7460 Unspecified cirrhosis of liver: Secondary | ICD-10-CM | POA: Diagnosis present

## 2019-05-14 DIAGNOSIS — R1013 Epigastric pain: Secondary | ICD-10-CM | POA: Insufficient documentation

## 2019-07-01 NOTE — Progress Notes (Deleted)
10:54 AM   Bradley Wolf 01/15/1929 [id number]  Referring provider: Lauro Regulus, MD [address] [facility] [place],  [place] [address]  No chief complaint on file.   HPI: Patient is an 85 year old male who presents today with interpreter, *** with a history of elevated PSA and BPH with LU TS.    BPH WITH LUTS His IPSS score today is ***, which is *** lower urinary tract symptomatology.  He is *** with his quality life due to his urinary symptoms.  His PVR is 11 mL.  His previous IPSS score was 3/4.   He is currently taking ***.   Patient denies any gross hematuria, dysuria or suprapubic/flank pain.  Patient denies any fevers, chills, nausea or vomiting.    Score:  1-7 Mild 8-19 Moderate 20-35 Severe   PMH: Past Medical History:  Diagnosis Date  . BPH (benign prostatic hyperplasia)   . CKD (chronic kidney disease)   . Elevated PSA   . Generalized weakness   . HTN (hypertension)   . Left groin pain   . Left hip pain     Surgical History: Past Surgical History:  Procedure Laterality Date  . LEG SURGERY  2004   due to accident   this is a knee surgery    Home Medications:  Allergies as of 07/02/2019   No Known Allergies     Medication List       Accurate as of July 01, 2019 10:54 AM. If you have any questions, ask your nurse or doctor.        acetaminophen 325 MG tablet Commonly known as: TYLENOL Take by mouth. Reported on 04/25/2016   alfuzosin 10 MG 24 hr tablet Commonly known as: UROXATRAL Take 1 tablet (10 mg total) by mouth daily with breakfast.   azelastine 0.1 % nasal spray Commonly known as: ASTELIN Place into the nose.   azithromycin 250 MG tablet Commonly known as: Zithromax 1 tablet daily for 4 more days   benzonatate 100 MG capsule Commonly known as: Tessalon Perles Take 1 capsule (100 mg total) by mouth 3 (three) times daily as needed for cough.   famotidine 20 MG tablet Commonly known as:  Pepcid Take 1 tablet (20 mg total) by mouth 2 (two) times daily.   finasteride 5 MG tablet Commonly known as: Proscar Take 1 tablet (5 mg total) by mouth daily.   fluticasone 50 MCG/ACT nasal spray Commonly known as: FLONASE Place into the nose.   pantoprazole 40 MG tablet Commonly known as: PROTONIX Take by mouth.   risperiDONE 0.25 MG tablet Commonly known as: RISPERDAL Take 0.25 mg by mouth at bedtime.   senna 8.6 MG Tabs tablet Commonly known as: SENOKOT Take 1 tablet by mouth.   tadalafil 5 MG tablet Commonly known as: CIALIS Take 1 tablet (5 mg total) by mouth daily as needed for erectile dysfunction.   tamsulosin 0.4 MG Caps capsule Commonly known as: FLOMAX Take 1 capsule (0.4 mg total) by mouth daily.   Up4 Probiotics Ultra Caps Take 1 tablet by mouth 2 (two) times daily.       Allergies: No Known Allergies  Family History: Family History  Problem Relation Age of Onset  . Benign prostatic hyperplasia Brother   . Prostate cancer Neg Hx   . Kidney disease Neg Hx   . Kidney cancer Neg Hx   . Bladder Cancer Neg Hx     Social History:  reports that  he quit smoking about 55 years ago. He has never used smokeless tobacco. He reports that he does not drink alcohol or use drugs.  ROS:                                        Physical Exam: There were no vitals taken for this visit.  Constitutional:  Well nourished. Alert and oriented, No acute distress. HEENT: [place] AT, moist mucus membranes.  Trachea midline, no masses. Cardiovascular: No clubbing, cyanosis, or edema. Respiratory: Normal respiratory effort, no increased work of breathing. GI: Abdomen is soft, non tender, non distended, no abdominal masses. Liver and spleen not palpable.  No hernias appreciated.  Stool sample for occult testing is not indicated.   GU: No CVA tenderness.  No bladder fullness or masses.  Patient with circumcised/uncircumcised phallus. ***Foreskin easily  retracted***  Urethral meatus is patent.  No penile discharge. No penile lesions or rashes. Scrotum without lesions, cysts, rashes and/or edema.  Testicles are located scrotally bilaterally. No masses are appreciated in the testicles. Left and right epididymis are normal. Rectal: Patient with  normal sphincter tone. Anus and perineum without scarring or rashes. No rectal masses are appreciated. Prostate is approximately *** grams, *** nodules are appreciated. Seminal vesicles are normal. Skin: No rashes, bruises or suspicious lesions. Lymph: No cervical or inguinal adenopathy. Neurologic: Grossly intact, no focal deficits, moving all 4 extremities. Psychiatric: Normal mood and affect.   Laboratory Data: Lab Results  Component Value Date   WBC 9.2 08/21/2018   HGB 14.0 08/21/2018   HCT 38.9 (L) 08/21/2018   MCV 87.2 08/21/2018   PLT 320 08/21/2018    Lab Results  Component Value Date   CREATININE 1.00 04/15/2019  PSA History:  7.9 ng/mL on 04/14/2014  8.3 ng/mL on 06/15/2014  3.9 ng/mL on 02/09/2015  1.9 ng/mL on 02/01/2016 I have reviewed the labs.   Assessment & Plan:    1. BPH with LUTS IPSS score is 3/4, it is worsening Continue conservative management, avoiding bladder irritants and timed voiding's Patient prescribed tadalafil 5 mg daily; he will call if it is cost prohibitive   Continue finasteride 5 mg daily; refills given RTC in 12 months for IPSS and exam   No follow-ups on file.  Zara Council, PA-C  [facility] [address] [place], [place] [address] [phone]

## 2019-07-02 ENCOUNTER — Ambulatory Visit: Payer: Medicare Other | Admitting: Urology

## 2019-07-02 ENCOUNTER — Encounter: Payer: Self-pay | Admitting: Urology

## 2019-07-09 ENCOUNTER — Other Ambulatory Visit: Payer: Self-pay | Admitting: Urology

## 2019-07-14 ENCOUNTER — Encounter: Payer: Self-pay | Admitting: Urology

## 2019-07-14 ENCOUNTER — Other Ambulatory Visit: Payer: Self-pay

## 2019-07-14 ENCOUNTER — Ambulatory Visit (INDEPENDENT_AMBULATORY_CARE_PROVIDER_SITE_OTHER): Payer: Medicare Other | Admitting: Urology

## 2019-07-14 VITALS — BP 168/64 | HR 87 | Ht 63.0 in | Wt 151.4 lb

## 2019-07-14 DIAGNOSIS — N529 Male erectile dysfunction, unspecified: Secondary | ICD-10-CM | POA: Diagnosis not present

## 2019-07-14 DIAGNOSIS — N138 Other obstructive and reflux uropathy: Secondary | ICD-10-CM

## 2019-07-14 DIAGNOSIS — N401 Enlarged prostate with lower urinary tract symptoms: Secondary | ICD-10-CM | POA: Diagnosis not present

## 2019-07-14 MED ORDER — TAMSULOSIN HCL 0.4 MG PO CAPS: 0.4000 mg | ORAL_CAPSULE | Freq: Every day | ORAL | 3 refills | Status: DC

## 2019-07-14 MED ORDER — FINASTERIDE 5 MG PO TABS: 5.0000 mg | ORAL_TABLET | Freq: Every day | ORAL | 3 refills | Status: DC

## 2019-07-14 MED ORDER — TADALAFIL 5 MG PO TABS: ORAL_TABLET | ORAL | 0 refills | Status: DC

## 2019-07-14 NOTE — Progress Notes (Signed)
11:27 AM   Bradley ReiningJose R Campos Wolf 1929/06/05 [id number]  Referring provider: Lauro RegulusAnderson, Marshall W, MD [address] [facility] [place] [id number]  Chief Complaint  Patient presents with  . Benign Prostatic Hypertrophy    HPI: Patient is an 85 year old male who presents today with interpreter, Louis through language line, with a history of elevated PSA and BPH with LU TS.    BPH WITH LUTS His IPSS score today is 14, which is moderate lower urinary tract symptomatology.  He is pleased with his quality life due to his urinary symptoms.  His PVR is 11 mL.  His previous IPSS score was 3/4.   He is currently taking finasteride 5 mg daily and tamsulosin 0.4 mg daily.   Patient denies any gross hematuria, dysuria or suprapubic/flank pain.  Patient denies any fevers, chills, nausea or vomiting.   IPSS    Row Name 07/14/19 1100         International Prostate Symptom Score   How often have you had the sensation of not emptying your bladder?  Not at All     How often have you had to urinate less than every two hours?  Almost always     How often have you found you stopped and started again several times when you urinated?  Not at All     How often have you found it difficult to postpone urination?  Not at All     How often have you had a weak urinary stream?  Almost always     How often have you had to strain to start urination?  Less than half the time     How many times did you typically get up at night to urinate?  2 Times     Total IPSS Score  14       Quality of Life due to urinary symptoms   If you were to spend the rest of your life with your urinary condition just the way it is now how would you feel about that?  Pleased        Score:  1-7 Mild 8-19 Moderate 20-35 Severe  ED He is asking for a prescription for Cialis.  He denies any pain with erections or curvature with erections.   He also denies taking any nitrates.   PMH: Past Medical  History:  Diagnosis Date  . BPH (benign prostatic hyperplasia)   . CKD (chronic kidney disease)   . Elevated PSA   . Generalized weakness   . HTN (hypertension)   . Left groin pain   . Left hip pain     Surgical History: Past Surgical History:  Procedure Laterality Date  . LEG SURGERY  2004   due to accident   this is a knee surgery    Home Medications:  Allergies as of 07/14/2019   No Known Allergies     Medication List       Accurate as of July 14, 2019 11:59 PM. If you have any questions, ask your nurse or doctor.        STOP taking these medications   alfuzosin 10 MG 24 hr tablet Commonly known as: UROXATRAL Stopped by: Coady Train, PA-C   azelastine 0.1 % nasal spray Commonly known as: ASTELIN Stopped by: Carilyn Woolston, PA-C   azithromycin 250 MG tablet Commonly known as: Zithromax Stopped by: Talin Feister, PA-C   benzonatate 100 MG capsule Commonly known as: LawyerTessalon Perles  Stopped by: Zara Council, PA-C     TAKE these medications   acetaminophen 325 MG tablet Commonly known as: TYLENOL Take by mouth. Reported on 04/25/2016   famotidine 20 MG tablet Commonly known as: Pepcid Take 1 tablet (20 mg total) by mouth 2 (two) times daily.   finasteride 5 MG tablet Commonly known as: Proscar Take 1 tablet (5 mg total) by mouth daily.   fluticasone 50 MCG/ACT nasal spray Commonly known as: FLONASE Place into the nose.   Opcon-A 0.027-0.315 % Soln Generic drug: Naphazoline-Pheniramine Apply to eye.   pantoprazole 40 MG tablet Commonly known as: PROTONIX Take by mouth.   risperiDONE 0.25 MG tablet Commonly known as: RISPERDAL Take 0.25 mg by mouth at bedtime.   senna 8.6 MG Tabs tablet Commonly known as: SENOKOT Take 1 tablet by mouth.   tadalafil 5 MG tablet Commonly known as: CIALIS TAKE 1 TABLET BY MOUTH ONCE DAILY AS NEEDED FOR ERECTILE DYSFUNCTION   tamsulosin 0.4 MG Caps capsule Commonly known as: FLOMAX Take 1  capsule (0.4 mg total) by mouth daily.   Up4 Probiotics Ultra Caps Take 1 tablet by mouth 2 (two) times daily.   Vitamin B 12 500 MCG Tabs Take by mouth.       Allergies: No Known Allergies  Family History: Family History  Problem Relation Age of Onset  . Benign prostatic hyperplasia Brother   . Prostate cancer Neg Hx   . Kidney disease Neg Hx   . Kidney cancer Neg Hx   . Bladder Cancer Neg Hx     Social History:  reports that he quit smoking about 55 years ago. He has never used smokeless tobacco. He reports that he does not drink alcohol or use drugs.  ROS: UROLOGY Frequent Urination?: No Hard to postpone urination?: No Burning/pain with urination?: No Get up at night to urinate?: Yes Leakage of urine?: No Urine stream starts and stops?: No Trouble starting stream?: No Do you have to strain to urinate?: No Blood in urine?: No Urinary tract infection?: No Sexually transmitted disease?: No Injury to kidneys or bladder?: No Painful intercourse?: No Weak stream?: Yes Erection problems?: No Penile pain?: No  Gastrointestinal Nausea?: No Vomiting?: No Indigestion/heartburn?: No Diarrhea?: No Constipation?: No  Constitutional Fever: No Night sweats?: No Weight loss?: No Fatigue?: Yes  Skin Skin rash/lesions?: No Itching?: No  Eyes Blurred vision?: No Double vision?: No  Ears/Nose/Throat Sore throat?: No Sinus problems?: No  Hematologic/Lymphatic Swollen glands?: No Easy bruising?: No  Cardiovascular Leg swelling?: No Chest pain?: No  Respiratory Cough?: No Shortness of breath?: No  Endocrine Excessive thirst?: Yes  Musculoskeletal Back pain?: No Joint pain?: No  Neurological Headaches?: Yes Dizziness?: No  Psychologic Depression?: No Anxiety?: No  Physical Exam: BP (!) 168/64 (BP Location: Left Arm, Patient Position: Sitting, Cuff Size: Normal)   Pulse 87   Ht 5\' 3"  (1.6 m)   Wt 151 lb 6.4 oz (68.7 kg)   BMI 26.82 kg/m    Constitutional:  Well nourished. Alert and oriented, No acute distress. HEENT: [place] AT, moist mucus membranes.  Trachea midline, no masses. Cardiovascular: No clubbing, cyanosis, or edema. Respiratory: Normal respiratory effort, no increased work of breathing. GI: Abdomen is soft, non tender, non distended, no abdominal masses. Liver and spleen not palpable.  No hernias appreciated.  Stool sample for occult testing is not indicated.   GU: No CVA tenderness.  No bladder fullness or masses.  Patient with uncircumcised phallus.  Foreskin easily retracted.  Urethral meatus is patent.  No penile discharge. No penile lesions or rashes. Scrotum without lesions, cysts, rashes and/or edema.  Testicles are located scrotally bilaterally. No masses are appreciated in the testicles. Left and right epididymis are normal. Rectal: Patient with  normal sphincter tone. Anus and perineum without scarring or rashes. No rectal masses are appreciated. Prostate is approximately 60 + grams, could only palpate the apex, no nodules are appreciated. Seminal vesicles could not be palpated Skin: No rashes, bruises or suspicious lesions. Lymph: No inguinal adenopathy. Neurologic: Grossly intact, no focal deficits, moving all 4 extremities. Psychiatric: Normal mood and affect.   Laboratory Data: Lab Results  Component Value Date   WBC 9.2 08/21/2018   HGB 14.0 08/21/2018   HCT 38.9 (L) 08/21/2018   MCV 87.2 08/21/2018   PLT 320 08/21/2018    Lab Results  Component Value Date   CREATININE 1.00 04/15/2019  PSA History:  7.9 ng/mL on 04/14/2014  8.3 ng/mL on 06/15/2014  3.9 ng/mL on 02/09/2015  1.9 ng/mL on 02/01/2016 I have reviewed the labs.   Assessment & Plan:    1. BPH with LUTS IPSS score is 14/1, it is worsening Continue conservative management, avoiding bladder irritants and timed voiding's His most bothersome symptoms are nocturia and a weak stream, but he is not wanting to be evaluated for BOO at this  time and would like to continue the medication Continue the tamsulosin 0.4 mg daily and the finasteride 5 mg daily - refills given RTC in one year for I PSS and exam  2. ED Refill given on Cialis 5 mg prn for intercourse   Return in about 1 year (around 07/13/2020) for I PSS and exam .  Michiel Cowboy, [facility]  [facility] [address] [place], [place] [address] [phone]

## 2019-07-15 ENCOUNTER — Ambulatory Visit: Payer: Self-pay | Admitting: Urology

## 2019-07-16 ENCOUNTER — Other Ambulatory Visit: Payer: Self-pay

## 2019-07-16 MED ORDER — TADALAFIL 5 MG PO TABS: ORAL_TABLET | ORAL | 6 refills | Status: DC

## 2019-10-20 DIAGNOSIS — F331 Major depressive disorder, recurrent, moderate: Secondary | ICD-10-CM | POA: Insufficient documentation

## 2019-11-24 ENCOUNTER — Other Ambulatory Visit: Payer: Self-pay | Admitting: Gastroenterology

## 2019-11-24 DIAGNOSIS — K7460 Unspecified cirrhosis of liver: Secondary | ICD-10-CM

## 2019-12-03 ENCOUNTER — Ambulatory Visit
Admission: RE | Admit: 2019-12-03 | Discharge: 2019-12-03 | Disposition: A | Discharge status: Home / Self Care | Payer: Medicare Other | Source: Ambulatory Visit | Attending: Gastroenterology | Admitting: Gastroenterology

## 2019-12-03 ENCOUNTER — Other Ambulatory Visit: Payer: Self-pay

## 2019-12-03 DIAGNOSIS — K7460 Unspecified cirrhosis of liver: Secondary | ICD-10-CM

## 2019-12-05 ENCOUNTER — Other Ambulatory Visit: Payer: Self-pay

## 2019-12-05 ENCOUNTER — Emergency Department
Admission: EM | Admit: 2019-12-05 | Discharge: 2019-12-06 | Disposition: A | Discharge status: Home / Self Care | Payer: Medicare Other | Attending: Emergency Medicine | Admitting: Emergency Medicine

## 2019-12-05 ENCOUNTER — Emergency Department: Payer: Medicare Other

## 2019-12-05 ENCOUNTER — Encounter: Payer: Self-pay | Admitting: Emergency Medicine

## 2019-12-05 DIAGNOSIS — R109 Unspecified abdominal pain: Secondary | ICD-10-CM

## 2019-12-05 DIAGNOSIS — I10 Essential (primary) hypertension: Secondary | ICD-10-CM | POA: Diagnosis not present

## 2019-12-05 DIAGNOSIS — Z87891 Personal history of nicotine dependence: Secondary | ICD-10-CM | POA: Diagnosis not present

## 2019-12-05 DIAGNOSIS — R1032 Left lower quadrant pain: Secondary | ICD-10-CM | POA: Insufficient documentation

## 2019-12-05 LAB — COMPREHENSIVE METABOLIC PANEL
ALT: 25 U/L (ref 0–44)
AST: 19 U/L (ref 15–41)
Albumin: 4.4 g/dL (ref 3.5–5.0)
Alkaline Phosphatase: 66 U/L (ref 38–126)
Anion gap: 7 (ref 5–15)
BUN: 19 mg/dL (ref 8–23)
CO2: 27 mmol/L (ref 22–32)
Calcium: 9.2 mg/dL (ref 8.9–10.3)
Chloride: 102 mmol/L (ref 98–111)
Creatinine, Ser: 1.11 mg/dL (ref 0.61–1.24)
GFR calc Af Amer: >60 mL/min (ref >60)
GFR calc non Af Amer: 59 mL/min — ABNORMAL LOW (ref >60)
Glucose, Bld: 155 mg/dL — ABNORMAL HIGH (ref 70–99)
Potassium: 3.6 mmol/L (ref 3.5–5.1)
Sodium: 136 mmol/L (ref 135–145)
Total Bilirubin: 0.7 mg/dL (ref 0.3–1.2)
Total Protein: 7.6 g/dL (ref 6.5–8.1)

## 2019-12-05 LAB — CBC
HCT: 38.8 % — ABNORMAL LOW (ref 39.0–52.0)
Hemoglobin: 13.3 g/dL (ref 13.0–17.0)
MCH: 29.3 pg (ref 26.0–34.0)
MCHC: 34.3 g/dL (ref 30.0–36.0)
MCV: 85.5 fL (ref 80.0–100.0)
Platelets: 205 10*3/uL (ref 150–400)
RBC: 4.54 MIL/uL (ref 4.22–5.81)
RDW: 12.7 % (ref 11.5–15.5)
WBC: 6.8 10*3/uL (ref 4.0–10.5)
nRBC: 0 % (ref 0.0–0.2)

## 2019-12-05 LAB — URINALYSIS, COMPLETE (UACMP) WITH MICROSCOPIC
Bacteria, UA: NONE SEEN
Bilirubin Urine: NEGATIVE
Glucose, UA: NEGATIVE mg/dL
Hgb urine dipstick: NEGATIVE
Ketones, ur: NEGATIVE mg/dL
Leukocytes,Ua: NEGATIVE
Nitrite: NEGATIVE
Protein, ur: NEGATIVE mg/dL
Specific Gravity, Urine: 1.01 (ref 1.005–1.030)
Squamous Epithelial / HPF: NONE SEEN (ref 0–5)
pH: 6 (ref 5.0–8.0)

## 2019-12-05 LAB — TROPONIN I (HIGH SENSITIVITY): Troponin I (High Sensitivity): 5 ng/L (ref <18)

## 2019-12-05 LAB — LIPASE, BLOOD: Lipase: 42 U/L (ref 11–51)

## 2019-12-05 MED ORDER — SODIUM CHLORIDE 0.9 % IV BOLUS
1000.0000 mL | Freq: Once | INTRAVENOUS | Status: AC
  Administered 2019-12-05: 23:00:00 1000 mL via INTRAVENOUS

## 2019-12-05 MED ORDER — SODIUM CHLORIDE 0.9% FLUSH: 3.0000 mL | Freq: Once | INTRAVENOUS | Status: DC

## 2019-12-05 MED ORDER — IOHEXOL 9 MG/ML PO SOLN
500.0000 mL | Freq: Two times a day (BID) | ORAL | Status: DC | PRN
  Administered 2019-12-05 (×2): 500 mL via ORAL

## 2019-12-05 MED ORDER — IOHEXOL 300 MG/ML  SOLN
100.0000 mL | Freq: Once | INTRAMUSCULAR | Status: AC | PRN
  Administered 2019-12-05: 23:00:00 100 mL via INTRAVENOUS

## 2019-12-05 NOTE — ED Provider Notes (Signed)
[facility] EMERGENCY DEPARTMENT Provider Note   CSN: [id number] Arrival date & time: 12/05/19  1711     History Chief Complaint  Patient presents with  . Abdominal Pain    Bradley Wolf Errol Ala is a 86 y.o. male hx of CKD, BPH, HTN, here presenting with abdominal pain.  Patient been having diffuse abdominal pain for the last 3 to 4 weeks.  Patient states that pain is intermittent.  He states that since yesterday is more constant.  He was unable to sleep due to the pain.  He denies being constipated.  He denies any urinary symptoms.  Denies any nausea or vomiting or fevers.  Denies any previous abdominal surgeries  The history is provided by the patient. The history is limited by a language barrier.       Past Medical History:  Diagnosis Date  . BPH (benign prostatic hyperplasia)   . CKD (chronic kidney disease)   . Elevated PSA   . Generalized weakness   . HTN (hypertension)   . Left groin pain   . Left hip pain     Patient Active Problem List   Diagnosis Date Noted  . BPH with obstruction/lower urinary tract symptoms 08/10/2015  . Elevated PSA 08/10/2015    Past Surgical History:  Procedure Laterality Date  . LEG SURGERY  2004   due to accident   this is a knee surgery       Family History  Problem Relation Age of Onset  . Benign prostatic hyperplasia Brother   . Prostate cancer Neg Hx   . Kidney disease Neg Hx   . Kidney cancer Neg Hx   . Bladder Cancer Neg Hx     Social History   Tobacco Use  . Smoking status: Former Smoker    Quit date: 1965    Years since quitting: 56.1  . Smokeless tobacco: Never Used  Substance Use Topics  . Alcohol use: No    Alcohol/week: 0.0 standard drinks  . Drug use: No    Home Medications Prior to Admission medications   Medication Sig Start Date End Date Taking? Authorizing Provider  acetaminophen (TYLENOL) 325 MG tablet Take by mouth. Reported on 04/25/2016    [provider]    Cyanocobalamin (VITAMIN B 12) 500 MCG TABS Take by mouth.    [provider]  famotidine (PEPCID) 20 MG tablet Take 1 tablet (20 mg total) by mouth 2 (two) times daily. Patient not taking: Reported on 07/14/2019 08/21/18   Earleen Newport, MD  finasteride (PROSCAR) 5 MG tablet Take 1 tablet (5 mg total) by mouth daily. 07/14/19   McGowan, Larene Beach A, PA-C  fluticasone (FLONASE) 50 MCG/ACT nasal spray Place into the nose.    [provider]  Naphazoline-Pheniramine (OPCON-A) 0.027-0.315 % SOLN Apply to eye.    [provider]  pantoprazole (PROTONIX) 40 MG tablet Take by mouth. 04/18/16 04/18/17  [provider]  Probiotic Product (UP4 PROBIOTICS ULTRA) CAPS Take 1 tablet by mouth 2 (two) times daily. Patient not taking: Reported on 07/14/2019 08/21/18   Earleen Newport, MD  risperiDONE (RISPERDAL) 0.25 MG tablet Take 0.25 mg by mouth at bedtime.    [provider]  senna (SENOKOT) 8.6 MG TABS tablet Take 1 tablet by mouth.    [provider]  tadalafil (CIALIS) 5 MG tablet TAKE 1 TABLET BY MOUTH ONCE DAILY AS NEEDED FOR ERECTILE DYSFUNCTION 07/16/19   Ernestine Conrad, Larene Beach A, PA-C  tamsulosin (FLOMAX) 0.4  MG CAPS capsule Take 1 capsule (0.4 mg total) by mouth daily. 07/14/19   Michiel Cowboy A, PA-C    Allergies    Patient has no known allergies.  Review of Systems   Review of Systems  Gastrointestinal: Positive for abdominal pain.  All other systems reviewed and are negative.   Physical Exam Updated Vital Signs BP (!) 161/68   Pulse 99   Temp 98.6 F (37 C) (Oral)   Resp 18   Wt 68.7 kg   SpO2 95%   BMI 26.83 kg/m   Physical Exam Vitals and nursing note reviewed.  Constitutional:      Appearance: He is well-developed.  HENT:     Head: Normocephalic.     Mouth/Throat:     Mouth: Mucous membranes are moist.  Eyes:     Extraocular Movements: Extraocular movements intact.  Cardiovascular:     Rate and Rhythm: Normal  rate and regular rhythm.     Heart sounds: Normal heart sounds.  Pulmonary:     Effort: Pulmonary effort is normal.     Breath sounds: Normal breath sounds.  Abdominal:     General: Abdomen is flat.     Comments: + LLQ tenderness, no rebound   Skin:    General: Skin is warm.     Capillary Refill: Capillary refill takes less than 2 seconds.  Neurological:     General: No focal deficit present.     Mental Status: He is alert.  Psychiatric:        Mood and Affect: Mood normal.        Behavior: Behavior normal.     ED Results / Procedures / Treatments   Labs (all labs ordered are listed, but only abnormal results are displayed) Labs Reviewed  COMPREHENSIVE METABOLIC PANEL - Abnormal; Notable for the following components:      Result Value   Glucose, Bld 155 (*)    GFR calc non Af Amer 59 (*)    All other components within normal limits  CBC - Abnormal; Notable for the following components:   HCT 38.8 (*)    All other components within normal limits  URINALYSIS, COMPLETE (UACMP) WITH MICROSCOPIC - Abnormal; Notable for the following components:   Color, Urine YELLOW (*)    APPearance CLEAR (*)    All other components within normal limits  LIPASE, BLOOD  TROPONIN I (HIGH SENSITIVITY)  TROPONIN I (HIGH SENSITIVITY)    EKG EKG Interpretation  Date/Time:  Sunday December 05 2019 17:36:52 EST Ventricular Rate:  94 PR Interval:  146 QRS Duration: 136 QT Interval:  388 QTC Calculation: 485 R Axis:   -54 Text Interpretation: Normal sinus rhythm Right bundle branch block Left anterior fascicular block Left ventricular hypertrophy with repolarization abnormality ( R in aVL ) Abnormal ECG When compared with ECG of 19-Aug-2018 09:05, PREVIOUS ECG IS PRESENT Confirmed by Richardean Canal [phone]) on 12/05/2019 10:31:52 PM   Radiology No results found.  Procedures Procedures (including critical care time)  Medications Ordered in ED Medications  sodium chloride flush (NS) 0.9 %  injection 3 mL (has no administration in time range)  iohexol (OMNIPAQUE) 9 MG/ML oral solution 500 mL (500 mLs Oral Contrast Given 12/05/19 2122)  sodium chloride 0.9 % bolus 1,000 mL (1,000 mLs Intravenous New Bag/Given 12/05/19 2251)    ED Course  I have reviewed the triage vital signs and the nursing notes.  Pertinent labs & imaging results that were available during my care of the  patient were reviewed by me and considered in my medical decision making (see chart for details).    MDM Rules/Calculators/A&P                      Mykale Gandolfo is a 86 y.o. male here with left lower quadrant pain. Consider diverticulitis versus colitis Patient is overall well-appearing.   Will get labs and urinalysis and CT abdomen pelvis.  11:08 PM Labs unremarkable. UA nl. CT ab/pel pending. Signed out to Dr. Fuller Plan in the ED.   Final Clinical Impression(s) / ED Diagnoses Final diagnoses:  None    Rx / DC Orders ED Discharge Orders    None       Charlynne Pander, MD 12/05/19 2308

## 2019-12-05 NOTE — ED Notes (Signed)
Pt to CT

## 2019-12-05 NOTE — ED Notes (Signed)
Son [name] given update, states he is leaving but that his brother is in the parking lot and can be reached.

## 2019-12-05 NOTE — ED Notes (Signed)
Bradley Wolf (son) [phone]

## 2019-12-05 NOTE — ED Triage Notes (Signed)
Pt to ER with c/o abdominal pain for several days.  Pt denies n/v/d/constipation.  PT only c/o pain.  Denies urinary c/o.

## 2019-12-06 ENCOUNTER — Other Ambulatory Visit: Payer: Self-pay | Admitting: Gastroenterology

## 2019-12-06 DIAGNOSIS — R1013 Epigastric pain: Secondary | ICD-10-CM

## 2019-12-06 NOTE — ED Notes (Signed)
Interpreter used to speak with pt. Pt up to use urinal with no assist. Pt concerned that family be called. This RN to call family

## 2019-12-06 NOTE — ED Notes (Signed)
Pt given dc instructions via interpreter. Pt told that this RN would bring him a wheelchair to wheel pt to lobby. Pt agreed. Pt stated he needed to use the bathroom,gait steady.  when this RN returned to take pt to lobby, pt had already left to lobby.

## 2019-12-06 NOTE — Discharge Instructions (Addendum)
Your work-up including CT scan was reassuring.  You can take Tylenol 1 g every 8 hours to help with your pain.  Return the ER for any other concerns

## 2019-12-06 NOTE — ED Notes (Signed)
This RN updated pt's son [name]

## 2019-12-06 NOTE — ED Provider Notes (Signed)
12:12 AM Assumed care for off going team.   Blood pressure (!) 161/68, pulse 99, temperature 98.6 F (37 C), temperature source Oral, resp. rate 18, weight 68.7 kg, SpO2 95 %.  See their HPI for full report but in brief  Abd pain 3-4 weeks but worsening yesterday located in the LLQ, needs translator. Plan to dc if scan looks okay .  Spanish interpreter was used.  CT scan was negative.  Updated patient on results.  Patient's abdomen is soft and nontender.  Denies any pain at this time.  Patient is comfortable discharge home.  I discussed the provisional nature of ED diagnosis, the treatment so far, the ongoing plan of care, follow up appointments and return precautions with the patient and any family or support people present. They expressed understanding and agreed with the plan, discharged home.           Concha Se, MD 12/06/19 [phone]

## 2019-12-10 ENCOUNTER — Ambulatory Visit: Payer: Medicare Other

## 2019-12-13 ENCOUNTER — Other Ambulatory Visit: Payer: Self-pay

## 2019-12-13 ENCOUNTER — Ambulatory Visit: Payer: Medicare Other

## 2019-12-13 ENCOUNTER — Ambulatory Visit
Admission: RE | Admit: 2019-12-13 | Discharge: 2019-12-13 | Disposition: A | Discharge status: Home / Self Care | Payer: Medicare Other | Source: Ambulatory Visit | Attending: Gastroenterology | Admitting: Gastroenterology

## 2019-12-13 DIAGNOSIS — R1013 Epigastric pain: Secondary | ICD-10-CM | POA: Insufficient documentation

## 2019-12-19 ENCOUNTER — Encounter: Payer: Self-pay | Admitting: Radiology

## 2019-12-19 ENCOUNTER — Emergency Department: Payer: Medicare Other

## 2019-12-19 ENCOUNTER — Other Ambulatory Visit: Payer: Self-pay

## 2019-12-19 ENCOUNTER — Emergency Department
Admission: EM | Admit: 2019-12-19 | Discharge: 2019-12-19 | Disposition: A | Discharge status: Home / Self Care | Payer: Medicare Other | Attending: Emergency Medicine | Admitting: Emergency Medicine

## 2019-12-19 DIAGNOSIS — I129 Hypertensive chronic kidney disease with stage 1 through stage 4 chronic kidney disease, or unspecified chronic kidney disease: Secondary | ICD-10-CM | POA: Diagnosis not present

## 2019-12-19 DIAGNOSIS — Z87891 Personal history of nicotine dependence: Secondary | ICD-10-CM | POA: Diagnosis not present

## 2019-12-19 DIAGNOSIS — R1110 Vomiting, unspecified: Secondary | ICD-10-CM | POA: Diagnosis not present

## 2019-12-19 DIAGNOSIS — R1013 Epigastric pain: Secondary | ICD-10-CM | POA: Diagnosis not present

## 2019-12-19 DIAGNOSIS — Z79899 Other long term (current) drug therapy: Secondary | ICD-10-CM | POA: Insufficient documentation

## 2019-12-19 DIAGNOSIS — N189 Chronic kidney disease, unspecified: Secondary | ICD-10-CM | POA: Insufficient documentation

## 2019-12-19 LAB — CBC WITH DIFFERENTIAL/PLATELET
Abs Immature Granulocytes: 0.03 10*3/uL (ref 0.00–0.07)
Basophils Absolute: 0 10*3/uL (ref 0.0–0.1)
Basophils Relative: 0 %
Eosinophils Absolute: 0.1 10*3/uL (ref 0.0–0.5)
Eosinophils Relative: 1 %
HCT: 41.8 % (ref 39.0–52.0)
Hemoglobin: 14.6 g/dL (ref 13.0–17.0)
Immature Granulocytes: 0 %
Lymphocytes Relative: 11 %
Lymphs Abs: 1.4 10*3/uL (ref 0.7–4.0)
MCH: 29.9 pg (ref 26.0–34.0)
MCHC: 34.9 g/dL (ref 30.0–36.0)
MCV: 85.5 fL (ref 80.0–100.0)
Monocytes Absolute: 0.8 10*3/uL (ref 0.1–1.0)
Monocytes Relative: 6 %
Neutro Abs: 10.5 10*3/uL — ABNORMAL HIGH (ref 1.7–7.7)
Neutrophils Relative %: 82 %
Platelets: 215 10*3/uL (ref 150–400)
RBC: 4.89 MIL/uL (ref 4.22–5.81)
RDW: 12.6 % (ref 11.5–15.5)
WBC: 12.9 10*3/uL — ABNORMAL HIGH (ref 4.0–10.5)
nRBC: 0 % (ref 0.0–0.2)

## 2019-12-19 LAB — COMPREHENSIVE METABOLIC PANEL
ALT: 31 U/L (ref 0–44)
AST: 22 U/L (ref 15–41)
Albumin: 4.6 g/dL (ref 3.5–5.0)
Alkaline Phosphatase: 70 U/L (ref 38–126)
Anion gap: 13 (ref 5–15)
BUN: 21 mg/dL (ref 8–23)
CO2: 24 mmol/L (ref 22–32)
Calcium: 9.8 mg/dL (ref 8.9–10.3)
Chloride: 97 mmol/L — ABNORMAL LOW (ref 98–111)
Creatinine, Ser: 1.16 mg/dL (ref 0.61–1.24)
GFR calc Af Amer: >60 mL/min (ref >60)
GFR calc non Af Amer: 56 mL/min — ABNORMAL LOW (ref >60)
Glucose, Bld: 130 mg/dL — ABNORMAL HIGH (ref 70–99)
Potassium: 3.8 mmol/L (ref 3.5–5.1)
Sodium: 134 mmol/L — ABNORMAL LOW (ref 135–145)
Total Bilirubin: 0.9 mg/dL (ref 0.3–1.2)
Total Protein: 8.2 g/dL — ABNORMAL HIGH (ref 6.5–8.1)

## 2019-12-19 LAB — URINALYSIS, ROUTINE W REFLEX MICROSCOPIC
Bilirubin Urine: NEGATIVE
Glucose, UA: NEGATIVE mg/dL
Hgb urine dipstick: NEGATIVE
Ketones, ur: 5 mg/dL — AB
Leukocytes,Ua: NEGATIVE
Nitrite: NEGATIVE
Protein, ur: NEGATIVE mg/dL
Specific Gravity, Urine: 1.041 — ABNORMAL HIGH (ref 1.005–1.030)
pH: 7 (ref 5.0–8.0)

## 2019-12-19 LAB — TROPONIN I (HIGH SENSITIVITY): Troponin I (High Sensitivity): 5 ng/L (ref <18)

## 2019-12-19 LAB — LIPASE, BLOOD: Lipase: 40 U/L (ref 11–51)

## 2019-12-19 MED ORDER — IOHEXOL 300 MG/ML  SOLN
100.0000 mL | Freq: Once | INTRAMUSCULAR | Status: AC | PRN
  Administered 2019-12-19: 100 mL via INTRAVENOUS

## 2019-12-19 MED ORDER — SODIUM CHLORIDE 0.9 % IV BOLUS
1000.0000 mL | Freq: Once | INTRAVENOUS | Status: AC
  Administered 2019-12-19: 1000 mL via INTRAVENOUS

## 2019-12-19 MED ORDER — ONDANSETRON HCL 4 MG/2ML IJ SOLN
4.0000 mg | Freq: Once | INTRAMUSCULAR | Status: AC
  Administered 2019-12-19: 4 mg via INTRAVENOUS
  Filled 2019-12-19: qty 2

## 2019-12-19 MED ORDER — HYDROMORPHONE HCL 1 MG/ML IJ SOLN
0.5000 mg | Freq: Once | INTRAMUSCULAR | Status: AC
  Administered 2019-12-19: 0.5 mg via INTRAVENOUS
  Filled 2019-12-19: qty 1

## 2019-12-19 MED ORDER — ONDANSETRON 4 MG PO TBDP: 4.0000 mg | ORAL_TABLET | Freq: Three times a day (TID) | ORAL | 0 refills | Status: DC | PRN

## 2019-12-19 MED ORDER — PANTOPRAZOLE SODIUM 40 MG IV SOLR
40.0000 mg | Freq: Once | INTRAVENOUS | Status: AC
  Administered 2019-12-19: 40 mg via INTRAVENOUS
  Filled 2019-12-19: qty 40

## 2019-12-19 NOTE — Discharge Instructions (Addendum)
Your imaging was reassuring.  You should call the GI team on Monday to get follow-up.  They may need to adjust her medications or consider further testing.  You can take the Zofran to help with any nausea or vomiting.    IMPRESSION:  1. No acute findings within the abdomen or pelvis. No bowel  obstruction or inflammation.  2. Aortic atherosclerosis.  3. Prostate hypertrophy.

## 2019-12-19 NOTE — ED Notes (Signed)
Pt has one incident of emesis following slow push diladid. Pt reported feeling woozy/dizzy, then vomited some bile

## 2019-12-19 NOTE — ED Provider Notes (Signed)
[facility] Emergency Department Provider Note  ____________________________________________   First MD Initiated Contact with Patient 12/19/19 [phone]     (approximate)  I have reviewed the triage vital signs and the nursing notes.   HISTORY  Chief Complaint Abdominal Pain and Emesis    HPI Berle Fitz Vaiden Adames is a 86 y.o. male with CKD, BPH, hypertension who comes in with abdominal pain.  Patient reports having intermittent abdominal pain for months now.  Patient was seen on 2/28 and had a CT scan that was negative at that time.  Patient also had what looks like an upper GI series with small bowel that shows moderate esophageal dysmotility with small hiatal hernia.  Appears that he is being seen at the [facility].  Patient is currently on hyosycamine.  Patient stated that he had a flareup of his pain yesterday.  Patient states that his pain started after eating soup with cheese and.  He states that he has had severe upper abdominal pain, constant, nothing makes it better, worse after trying to eat.  It has been associated with some nonbloody nonbilious vomiting.  Denies any chest pain or shortness of breath.  Denies any lower abdominal pain.  License Spanish interpreter was used for history and physical any updates          Past Medical History:  Diagnosis Date  . BPH (benign prostatic hyperplasia)   . CKD (chronic kidney disease)   . Elevated PSA   . Generalized weakness   . HTN (hypertension)   . Left groin pain   . Left hip pain     Patient Active Problem List   Diagnosis Date Noted  . BPH with obstruction/lower urinary tract symptoms 08/10/2015  . Elevated PSA 08/10/2015    Past Surgical History:  Procedure Laterality Date  . LEG SURGERY  2004   due to accident   this is a knee surgery    Prior to Admission medications   Medication Sig Start Date End Date Taking? Authorizing Provider  acetaminophen (TYLENOL) 325 MG tablet Take  by mouth. Reported on 04/25/2016    [provider]  Cyanocobalamin (VITAMIN B 12) 500 MCG TABS Take by mouth.    [provider]  famotidine (PEPCID) 20 MG tablet Take 1 tablet (20 mg total) by mouth 2 (two) times daily. Patient not taking: Reported on 07/14/2019 08/21/18   Emily Filbert, MD  finasteride (PROSCAR) 5 MG tablet Take 1 tablet (5 mg total) by mouth daily. 07/14/19   McGowan, Carollee Herter A, PA-C  fluticasone (FLONASE) 50 MCG/ACT nasal spray Place into the nose.    [provider]  Naphazoline-Pheniramine (OPCON-A) 0.027-0.315 % SOLN Apply to eye.    [provider]  pantoprazole (PROTONIX) 40 MG tablet Take by mouth. 04/18/16 04/18/17  [provider]  Probiotic Product (UP4 PROBIOTICS ULTRA) CAPS Take 1 tablet by mouth 2 (two) times daily. Patient not taking: Reported on 07/14/2019 08/21/18   Emily Filbert, MD  risperiDONE (RISPERDAL) 0.25 MG tablet Take 0.25 mg by mouth at bedtime.    [provider]  senna (SENOKOT) 8.6 MG TABS tablet Take 1 tablet by mouth.    [provider]  tadalafil (CIALIS) 5 MG tablet TAKE 1 TABLET BY MOUTH ONCE DAILY AS NEEDED FOR ERECTILE DYSFUNCTION 07/16/19   Marvel Plan, Carollee Herter A, PA-C  tamsulosin (FLOMAX) 0.4 MG CAPS capsule Take 1 capsule (0.4 mg total) by mouth daily. 07/14/19   Harle Battiest, PA-C  Allergies Patient has no known allergies.  Family History  Problem Relation Age of Onset  . Benign prostatic hyperplasia Brother   . Prostate cancer Neg Hx   . Kidney disease Neg Hx   . Kidney cancer Neg Hx   . Bladder Cancer Neg Hx     Social History Social History   Tobacco Use  . Smoking status: Former Smoker    Quit date: 1965    Years since quitting: 56.2  . Smokeless tobacco: Never Used  Substance Use Topics  . Alcohol use: No    Alcohol/week: 0.0 standard drinks  . Drug use: No      Review of Systems Constitutional: No fever/chills Eyes: No visual  changes. ENT: No sore throat. Cardiovascular: Denies chest pain. Respiratory: Denies shortness of breath. Gastrointestinal: Positive abdominal pain and vomiting. Genitourinary: Negative for dysuria. Musculoskeletal: Negative for back pain. Skin: Negative for rash. Neurological: Negative for headaches, focal weakness or numbness. All other ROS negative ____________________________________________   PHYSICAL EXAM:  VITAL SIGNS: ED Triage Vitals [12/19/19 0734]  Enc Vitals Group     BP (!) 176/67     Pulse Rate (!) 101     Resp 16     Temp 98.4 F (36.9 C)     Temp Source Oral     SpO2 96 %     Weight      Height      Head Circumference      Peak Flow      Pain Score      Pain Loc      Pain Edu?      Excl. in GC?     Constitutional: Alert and oriented. Well appearing and in no acute distress. Eyes: Conjunctivae are normal. EOMI. Head: Atraumatic. Nose: No congestion/rhinnorhea. Mouth/Throat: Mucous membranes are moist.   Neck: No stridor. Trachea Midline. FROM Cardiovascular: Slightly tachycardic, regular rhythm. Grossly normal heart sounds.  Good peripheral circulation. Respiratory: Normal respiratory effort.  No retractions. Lungs CTAB. Gastrointestinal: Tender in the upper abdomen without rebound or guarding. No distention. No abdominal bruits.  Musculoskeletal: No lower extremity tenderness nor edema.  No joint effusions. Neurologic:  Normal speech and language. No gross focal neurologic deficits are appreciated.  Skin:  Skin is warm, dry and intact. No rash noted. Psychiatric: Mood and affect are normal. Speech and behavior are normal. GU: Deferred   ____________________________________________   LABS (all labs ordered are listed, but only abnormal results are displayed)  Labs Reviewed  CBC WITH DIFFERENTIAL/PLATELET - Abnormal; Notable for the following components:      Result Value   WBC 12.9 (*)    Neutro Abs 10.5 (*)    All other components within  normal limits  COMPREHENSIVE METABOLIC PANEL - Abnormal; Notable for the following components:   Sodium 134 (*)    Chloride 97 (*)    Glucose, Bld 130 (*)    Total Protein 8.2 (*)    GFR calc non Af Amer 56 (*)    All other components within normal limits  URINALYSIS, ROUTINE W REFLEX MICROSCOPIC - Abnormal; Notable for the following components:   Color, Urine YELLOW (*)    APPearance CLEAR (*)    Specific Gravity, Urine 1.041 (*)    Ketones, ur 5 (*)    All other components within normal limits  LIPASE, BLOOD  TROPONIN I (HIGH SENSITIVITY)   ____________________________________________   ED ECG REPORT I, Concha Se, the attending physician, personally viewed and interpreted this ECG.  EKG is normal sinus rate 99, right bundle branch block and left anterior fascicular block, similar T wave inversions, no ST elevations ____________________________________________  RADIOLOGY   Official radiology report(s): CT ABDOMEN PELVIS W CONTRAST  Result Date: 12/19/2019 CLINICAL DATA:  Triage note: Pt arrives from home via POV with complaint of severe stomach pain and vomiting, which began after eating yesterday afternoon. EXAM: CT ABDOMEN AND PELVIS WITH CONTRAST TECHNIQUE: Multidetector CT imaging of the abdomen and pelvis was performed using the standard protocol following bolus administration of intravenous contrast. CONTRAST:  OMNIPAQUE IOHEXOL 300 MG/ML  SOLN COMPARISON:  12/05/2019 FINDINGS: Lower chest: No acute abnormality. Hepatobiliary: Normal in size and overall attenuation. Tiny low-density lesion at the dome is likely a cyst. No other masses or lesions. Normal gallbladder. No bile duct dilation. Pancreas: Mild dilation of the common bile duct. No mass or inflammation. Spleen: Normal in size without focal abnormality. Adrenals/Urinary Tract: No adrenal masses. Kidneys normal in position and orientation with symmetric enhancement and excretion. 3-4 mm low-attenuation lesion,  lower pole the right kidney, too small to fully characterize but consistent with a cyst, stable. No other renal masses or lesions. No hydronephrosis. Normal ureters. Normal bladder. Stomach/Bowel: Stomach mild-to-moderately distended with fluid and nondependent air. No wall thickening or adjacent inflammation. Small bowel and colon are normal in caliber. No wall thickening. No inflammation. Short normal caliber appendix noted. Vascular/Lymphatic: Aortic atherosclerosis. No enlarged lymph nodes. Reproductive: Enlarged prostate measuring 5.6 x 4.2 x 4.8 cm. Other: Fat containing small right inguinal hernia, with a loop of small bowel just entering the hernia, without obstruction or evidence of incarceration/strangulation. This is stable. No ascites. Musculoskeletal: No fracture or acute finding. No osteoblastic or osteolytic lesions. IMPRESSION: 1. No acute findings within the abdomen or pelvis. No bowel obstruction or inflammation. 2. Aortic atherosclerosis. 3. Prostate hypertrophy. Electronically Signed   By: Amie Portland M.D.   On: 12/19/2019 09:49    ____________________________________________   PROCEDURES  Procedure(s) performed (including Critical Care):  Procedures   ____________________________________________   INITIAL IMPRESSION / ASSESSMENT AND PLAN / ED COURSE  Labron Bloodgood Kethan Papadopoulos was evaluated in Emergency Department on 12/19/2019 for the symptoms described in the history of present illness. He was evaluated in the context of the global COVID-19 pandemic, which necessitated consideration that the patient might be at risk for infection with the SARS-CoV-2 virus that causes COVID-19. Institutional protocols and algorithms that pertain to the evaluation of patients at risk for COVID-19 are in a state of rapid change based on information released by regulatory bodies including the CDC and federal and state organizations. These policies and algorithms were followed during the patient's care  in the ED.    Patient is an 86 year old who is slightly tachycardic who comes in for epigastric abdominal pain.  Patient has been followed by [facility] clinic GI for this.  Given patient states that his pain is acutely worse we will do repeat CT imaging to make sure no evidence of perforation, obstruction, diverticulitis.  Will get labs to evaluate for electrolyte abnormalities, AKI.  Will give IV Dilaudid and some IV Zofran given significant pain and vomiting.  Patient denies any shortness of breath to suggest PE and seems to have happened right after eating.  He denies any chest pain or low suspicion for ACS but given it is epigastric will get EKG and cardiac markers.   Patient slightly desatted to 94% after the IV Dilaudid but was able to recover and now his saturation  is 98%.  Troponin is 5 and symptoms been greater than 3 hours and ruled out for heart attack.  White count slightly elevated but urine without evidence of UTI.  CT scan was negative for acute process, patient provided a copy of his report with his enlarged prostate.  Updated patient on the results with son at bedside.  Patient states that he is frustrated given he has been seeing GI for some time now and they have not been able to figure out what is causing his abdominal pain.  We discussed that he needs to continue to follow-up with them as they continue to do additional testing and trialing different medications.  I discussed that I do not have any other medications that I could trial here given patient is already on PPI Carafate and antispasmodic medications.  We discussed the importance of calling the GI team on Monday.  Both patient and son expressed understanding and felt comfortable with this pain.  Patient looks well-appearing and has been having this pain intermittently now for over 1 year.  I discussed the provisional nature of ED diagnosis, the treatment so far, the ongoing plan of care, follow up appointments and return  precautions with the patient and any family or support people present. They expressed understanding and agreed with the plan, discharged home.    ____________________________________________   FINAL CLINICAL IMPRESSION(S) / ED DIAGNOSES   Final diagnoses:  Epigastric pain      MEDICATIONS GIVEN DURING THIS VISIT:  Medications  ondansetron (ZOFRAN) injection 4 mg (4 mg Intravenous Given 12/19/19 0803)  HYDROmorphone (DILAUDID) injection 0.5 mg (0.5 mg Intravenous Given 12/19/19 0802)  pantoprazole (PROTONIX) injection 40 mg (40 mg Intravenous Given 12/19/19 0802)  sodium chloride 0.9 % bolus 1,000 mL (0 mLs Intravenous Stopped 12/19/19 1050)  iohexol (OMNIPAQUE) 300 MG/ML solution 100 mL (100 mLs Intravenous Contrast Given 12/19/19 6144)     ED Discharge Orders         Ordered    ondansetron (ZOFRAN ODT) 4 MG disintegrating tablet  Every 8 hours PRN     12/19/19 1127           Note:  This document was prepared using Dragon voice recognition software and may include unintentional dictation errors.   Vanessa Fairview, MD 12/19/19 1128

## 2019-12-19 NOTE — ED Triage Notes (Signed)
Pt arrives from home via POV with complaint of severe stomach pain and vomiting, which began after eating yesterday afternoon

## 2019-12-20 ENCOUNTER — Other Ambulatory Visit: Payer: Self-pay | Admitting: Gastroenterology

## 2019-12-20 DIAGNOSIS — R1013 Epigastric pain: Secondary | ICD-10-CM

## 2019-12-24 ENCOUNTER — Other Ambulatory Visit: Payer: Self-pay

## 2019-12-24 ENCOUNTER — Ambulatory Visit
Admission: RE | Admit: 2019-12-24 | Discharge: 2019-12-24 | Disposition: A | Discharge status: Home / Self Care | Payer: Medicare Other | Source: Ambulatory Visit | Attending: Gastroenterology | Admitting: Gastroenterology

## 2019-12-24 DIAGNOSIS — R1013 Epigastric pain: Secondary | ICD-10-CM

## 2019-12-27 ENCOUNTER — Ambulatory Visit
Admission: RE | Admit: 2019-12-27 | Discharge: 2019-12-27 | Disposition: A | Discharge status: Home / Self Care | Payer: Medicare Other | Source: Ambulatory Visit | Attending: Gastroenterology | Admitting: Gastroenterology

## 2019-12-27 ENCOUNTER — Other Ambulatory Visit: Payer: Self-pay

## 2019-12-27 DIAGNOSIS — R1013 Epigastric pain: Secondary | ICD-10-CM | POA: Insufficient documentation

## 2020-01-14 ENCOUNTER — Other Ambulatory Visit: Payer: Self-pay

## 2020-01-14 ENCOUNTER — Other Ambulatory Visit
Admission: RE | Admit: 2020-01-14 | Discharge: 2020-01-14 | Disposition: A | Discharge status: Home / Self Care | Payer: Medicare Other | Source: Ambulatory Visit | Attending: Internal Medicine | Admitting: Internal Medicine

## 2020-01-14 DIAGNOSIS — Z20822 Contact with and (suspected) exposure to covid-19: Secondary | ICD-10-CM | POA: Insufficient documentation

## 2020-01-14 DIAGNOSIS — Z01812 Encounter for preprocedural laboratory examination: Secondary | ICD-10-CM | POA: Insufficient documentation

## 2020-01-14 LAB — SARS CORONAVIRUS 2 (TAT 6-24 HRS): SARS Coronavirus 2: NEGATIVE

## 2020-01-18 ENCOUNTER — Encounter: Payer: Self-pay | Admitting: Internal Medicine

## 2020-01-19 ENCOUNTER — Encounter
Admission: RE | Disposition: A | Discharge status: Home / Self Care | Payer: Self-pay | Source: Home / Self Care | Attending: Internal Medicine

## 2020-01-19 ENCOUNTER — Ambulatory Visit
Admission: RE | Admit: 2020-01-19 | Discharge: 2020-01-19 | Disposition: A | Discharge status: Home / Self Care | Payer: Medicare Other | Attending: Internal Medicine | Admitting: Internal Medicine

## 2020-01-19 ENCOUNTER — Encounter: Payer: Self-pay | Admitting: Internal Medicine

## 2020-01-19 ENCOUNTER — Ambulatory Visit: Payer: Medicare Other | Admitting: Anesthesiology

## 2020-01-19 DIAGNOSIS — N400 Enlarged prostate without lower urinary tract symptoms: Secondary | ICD-10-CM | POA: Diagnosis not present

## 2020-01-19 DIAGNOSIS — Z8711 Personal history of peptic ulcer disease: Secondary | ICD-10-CM | POA: Insufficient documentation

## 2020-01-19 DIAGNOSIS — N189 Chronic kidney disease, unspecified: Secondary | ICD-10-CM | POA: Diagnosis not present

## 2020-01-19 DIAGNOSIS — K2970 Gastritis, unspecified, without bleeding: Secondary | ICD-10-CM | POA: Insufficient documentation

## 2020-01-19 DIAGNOSIS — F329 Major depressive disorder, single episode, unspecified: Secondary | ICD-10-CM | POA: Insufficient documentation

## 2020-01-19 DIAGNOSIS — R1013 Epigastric pain: Secondary | ICD-10-CM | POA: Diagnosis present

## 2020-01-19 DIAGNOSIS — I129 Hypertensive chronic kidney disease with stage 1 through stage 4 chronic kidney disease, or unspecified chronic kidney disease: Secondary | ICD-10-CM | POA: Diagnosis not present

## 2020-01-19 DIAGNOSIS — Z79899 Other long term (current) drug therapy: Secondary | ICD-10-CM | POA: Insufficient documentation

## 2020-01-19 HISTORY — PX: ESOPHAGOGASTRODUODENOSCOPY (EGD) WITH PROPOFOL: SHX5813

## 2020-01-19 HISTORY — DX: Depression, unspecified: F32.A

## 2020-01-19 SURGERY — ESOPHAGOGASTRODUODENOSCOPY (EGD) WITH PROPOFOL
Anesthesia: General

## 2020-01-19 MED ORDER — PROPOFOL 10 MG/ML IV BOLUS
INTRAVENOUS | Status: DC | PRN
  Administered 2020-01-19: 50 mg via INTRAVENOUS

## 2020-01-19 MED ORDER — PROPOFOL 500 MG/50ML IV EMUL
INTRAVENOUS | Status: AC
  Filled 2020-01-19: qty 50

## 2020-01-19 MED ORDER — PROPOFOL 500 MG/50ML IV EMUL
INTRAVENOUS | Status: DC | PRN
  Administered 2020-01-19: 175 ug/kg/min via INTRAVENOUS

## 2020-01-19 MED ORDER — SODIUM CHLORIDE 0.9 % IV SOLN
INTRAVENOUS | Status: DC
  Administered 2020-01-19: 1000 mL via INTRAVENOUS

## 2020-01-19 NOTE — Interval H&P Note (Signed)
History and Physical Interval Note:  01/19/2020 1:07 PM  Bradley Wolf  has presented today for surgery, with the diagnosis of GERD EPIGASTRIC PAIN.  The various methods of treatment have been discussed with the patient and family. After consideration of risks, benefits and other options for treatment, the patient has consented to  Procedure(s): ESOPHAGOGASTRODUODENOSCOPY (EGD) WITH PROPOFOL (N/A) as a surgical intervention.  The patient's history has been reviewed, patient examined, no change in status, stable for surgery.  I have reviewed the patient's chart and labs.  Questions were answered to the patient's satisfaction.     [place], [place]

## 2020-01-19 NOTE — Anesthesia Preprocedure Evaluation (Signed)
Anesthesia Evaluation  Patient identified by MRN, date of birth, ID band Patient awake    Reviewed: Allergy & Precautions, NPO status , Patient's Chart, lab work & pertinent test results  Airway Mallampati: II       Dental   Pulmonary former smoker,    Pulmonary exam normal        Cardiovascular hypertension, Normal cardiovascular exam     Neuro/Psych PSYCHIATRIC DISORDERS Depression negative neurological ROS     GI/Hepatic Neg liver ROS,   Endo/Other  negative endocrine ROS  Renal/GU Renal InsufficiencyRenal disease  negative genitourinary   Musculoskeletal negative musculoskeletal ROS (+)   Abdominal Normal abdominal exam  (+)   Peds negative pediatric ROS (+)  Hematology negative hematology ROS (+)   Anesthesia Other Findings Past Medical History: No date: BPH (benign prostatic hyperplasia) No date: CKD (chronic kidney disease) No date: Depression No date: Elevated PSA No date: Generalized weakness No date: HTN (hypertension) No date: Left groin pain No date: Left hip pain  Reproductive/Obstetrics                             Anesthesia Physical Anesthesia Plan  ASA: III  Anesthesia Plan: General   Post-op Pain Management:    Induction: Intravenous  PONV Risk Score and Plan:   Airway Management Planned: Nasal Cannula  Additional Equipment:   Intra-op Plan:   Post-operative Plan:   Informed Consent: I have reviewed the patients History and Physical, chart, labs and discussed the procedure including the risks, benefits and alternatives for the proposed anesthesia with the patient or authorized representative who has indicated his/her understanding and acceptance.     Dental advisory given  Plan Discussed with: CRNA and Surgeon  Anesthesia Plan Comments:         Anesthesia Quick Evaluation

## 2020-01-19 NOTE — H&P (Signed)
  Outpatient short stay form Pre-procedure 01/19/2020 12:37 PM Bradley Wolf K. Norma Fredrickson, M.D.  Primary Physician: Einar Crow, M.D.  Reason for visit:  Epigastric pain, hx h pylori gastritis, remote hx of gastric ulcer.  History of present illness:  86 y/o hispanic male presents for endoscopy to investigate epigastric pain. HE has not responded to antacids or antipsychotic therapy. He has no melena, hemetemesis or hematochezia. CT Scan of the abdomen and pelvis has been unremarkable.    No current facility-administered medications for this encounter.  Medications Prior to Admission  Medication Sig Dispense Refill Last Dose  . amitriptyline (ELAVIL) 10 MG tablet Take 10 mg by mouth at bedtime.     Marland Kitchen acetaminophen (TYLENOL) 325 MG tablet Take by mouth. Reported on 04/25/2016     . Cyanocobalamin (VITAMIN B 12) 500 MCG TABS Take by mouth.     . famotidine (PEPCID) 20 MG tablet Take 1 tablet (20 mg total) by mouth 2 (two) times daily. (Patient not taking: Reported on 07/14/2019) 60 tablet 1   . finasteride (PROSCAR) 5 MG tablet Take 1 tablet (5 mg total) by mouth daily. 90 tablet 3   . fluticasone (FLONASE) 50 MCG/ACT nasal spray Place into the nose.     . Naphazoline-Pheniramine (OPCON-A) 0.027-0.315 % SOLN Apply to eye.     . ondansetron (ZOFRAN ODT) 4 MG disintegrating tablet Take 1 tablet (4 mg total) by mouth every 8 (eight) hours as needed for nausea or vomiting. 20 tablet 0   . pantoprazole (PROTONIX) 40 MG tablet Take by mouth.     . Probiotic Product (UP4 PROBIOTICS ULTRA) CAPS Take 1 tablet by mouth 2 (two) times daily. (Patient not taking: Reported on 07/14/2019) 30 capsule 1   . risperiDONE (RISPERDAL) 0.25 MG tablet Take 0.25 mg by mouth at bedtime.     . senna (SENOKOT) 8.6 MG TABS tablet Take 1 tablet by mouth.     . tadalafil (CIALIS) 5 MG tablet TAKE 1 TABLET BY MOUTH ONCE DAILY AS NEEDED FOR ERECTILE DYSFUNCTION 30 tablet 6   . tamsulosin (FLOMAX) 0.4 MG CAPS capsule Take 1  capsule (0.4 mg total) by mouth daily. 90 capsule 3      No Known Allergies   Past Medical History:  Diagnosis Date  . BPH (benign prostatic hyperplasia)   . CKD (chronic kidney disease)   . Depression   . Elevated PSA   . Generalized weakness   . HTN (hypertension)   . Left groin pain   . Left hip pain     Review of systems:  Otherwise negative.    Physical Exam  Gen: Alert, oriented. Appears stated age.  HEENT: [place]/AT. PERRLA. Lungs: CTA, no wheezes. CV: RR nl S1, S2. Abd: soft, benign, no masses. BS+ Ext: No edema. Pulses 2+    Planned procedures: Proceed with EGD with biopsy. The patient understands the nature of the planned procedure, indications, risks, alternatives and potential complications including but not limited to bleeding, infection, perforation, damage to internal organs and possible oversedation/side effects from anesthesia. The patient agrees and gives consent to proceed.  Please refer to procedure notes for findings, recommendations and patient disposition/instructions.     Naeema Patlan K. Norma Fredrickson, M.D. Gastroenterology 01/19/2020  12:37 PM

## 2020-01-19 NOTE — Op Note (Signed)
[facility] Gastroenterology Patient Name: Bradley Wolf Procedure Date: 01/19/2020 12:59 PM MRN: [id number] Account #: [id number] Date of Birth: Jul 27, 1929 Admit Type: Outpatient Age: 86 Room: [facility] ENDO ROOM 3 Gender: Male Note Status: Finalized Procedure:             Upper GI endoscopy Indications:           Epigastric abdominal pain, Suspected Helicobacter                         pylori Providers:             Lorie Apley K. Denver Harder MD, MD Medicines:             Propofol per Anesthesia Complications:         No immediate complications. Procedure:             Pre-Anesthesia Assessment:                        - The risks and benefits of the procedure and the                         sedation options and risks were discussed with the                         patient. All questions were answered and informed                         consent was obtained.                        - Patient identification and proposed procedure were                         verified prior to the procedure by the nurse. The                         procedure was verified in the procedure room.                        - ASA Grade Assessment: III - A patient with severe                         systemic disease.                        - After reviewing the risks and benefits, the patient                         was deemed in satisfactory condition to undergo the                         procedure.                        After obtaining informed consent, the endoscope was                         passed under direct vision. Throughout the procedure,  the patient's blood pressure, pulse, and oxygen                         saturations were monitored continuously. The Endoscope                         was introduced through the mouth, and advanced to the                         third part of duodenum. The upper GI endoscopy was                         accomplished without  difficulty. The patient tolerated                         the procedure well. Findings:      The esophagus was normal.      Localized mild inflammation characterized by erosions and erythema was       found in the gastric antrum. Biopsies were taken with a cold forceps for       Helicobacter pylori testing.      The cardia and gastric fundus were normal on retroflexion.      The examined duodenum was normal.      The exam was otherwise without abnormality. Impression:            - Normal esophagus.                        - Gastritis. Biopsied.                        - Normal examined duodenum.                        - The examination was otherwise normal. Recommendation:        - Patient has a contact number available for                         emergencies. The signs and symptoms of potential                         delayed complications were discussed with the patient.                         Return to normal activities tomorrow. Written                         discharge instructions were provided to the patient.                        - Resume previous diet.                        - Continue present medications.                        - Await pathology results.                        - Return to physician assistant PRN.                        -  The findings and recommendations were discussed with                         the patient. Procedure Code(s):     --- Professional ---                        [phone], Esophagogastroduodenoscopy, flexible,                         transoral; with biopsy, single or multiple Diagnosis Code(s):     --- Professional ---                        R10.13, Epigastric pain                        K29.70, Gastritis, unspecified, without bleeding CPT copyright 2019 American Medical Association. All rights reserved. The codes documented in this report are preliminary and upon coder review may  be revised to meet current compliance requirements. Stanton Kidney MD, MD 01/19/2020 1:28:41 PM This report has been signed electronically. Number of Addenda: 0 Note Initiated On: 01/19/2020 12:59 PM Estimated Blood Loss:  Estimated blood loss: none.      [facility]

## 2020-01-19 NOTE — Transfer of Care (Signed)
Immediate Anesthesia Transfer of Care Note  Patient: Bradley Wolf  Procedure(s) Performed: ESOPHAGOGASTRODUODENOSCOPY (EGD) WITH PROPOFOL (N/A )  Patient Location: Endoscopy Unit  Anesthesia Type:General  Level of Consciousness: awake, alert  and oriented  Airway & Oxygen Therapy: Patient Spontanous Breathing and Patient connected to nasal cannula oxygen  Post-op Assessment: Report given to RN and Post -op Vital signs reviewed and stable  Post vital signs: Reviewed and stable  Last Vitals:  Vitals Value Taken Time  BP 108/58 01/19/20 1329  Temp 36.2 C 01/19/20 1329  Pulse 74 01/19/20 1330  Resp 17 01/19/20 1330  SpO2 98 % 01/19/20 1330  Vitals shown include unvalidated device data.  Last Pain:  Vitals:   01/19/20 1329  TempSrc:   PainSc: (P) Asleep         Complications: No apparent anesthesia complications

## 2020-01-20 ENCOUNTER — Encounter: Payer: Self-pay | Admitting: *Deleted

## 2020-01-20 LAB — SURGICAL PATHOLOGY

## 2020-01-20 NOTE — Anesthesia Postprocedure Evaluation (Signed)
Anesthesia Post Note  Patient: Bradley Wolf  Procedure(s) Performed: ESOPHAGOGASTRODUODENOSCOPY (EGD) WITH PROPOFOL (N/A )  Patient location during evaluation: Endoscopy Anesthesia Type: General Level of consciousness: awake and alert and oriented Pain management: pain level controlled Vital Signs Assessment: post-procedure vital signs reviewed and stable Respiratory status: spontaneous breathing Cardiovascular status: blood pressure returned to baseline Anesthetic complications: no     Last Vitals:  Vitals:   01/19/20 1349 01/19/20 1359  BP: (!) 143/73 138/76  Pulse: 69 67  Resp: 14 16  Temp:    SpO2: 97% 96%    Last Pain:  Vitals:   01/19/20 1359  TempSrc:   PainSc: 0-No pain                 [place]

## 2020-03-09 ENCOUNTER — Encounter: Payer: Self-pay | Admitting: Urology

## 2020-03-09 ENCOUNTER — Other Ambulatory Visit: Payer: Self-pay

## 2020-03-09 ENCOUNTER — Ambulatory Visit (INDEPENDENT_AMBULATORY_CARE_PROVIDER_SITE_OTHER): Payer: Medicare Other | Admitting: Urology

## 2020-03-09 VITALS — BP 160/67 | HR 97 | Ht 66.0 in | Wt 147.0 lb

## 2020-03-09 DIAGNOSIS — N138 Other obstructive and reflux uropathy: Secondary | ICD-10-CM

## 2020-03-09 DIAGNOSIS — N401 Enlarged prostate with lower urinary tract symptoms: Secondary | ICD-10-CM | POA: Diagnosis not present

## 2020-03-09 LAB — BLADDER SCAN AMB NON-IMAGING: Scan Result: 0

## 2020-03-09 NOTE — Progress Notes (Signed)
3:33 PM   [place] June 26, 1929 [id number]  Referring provider: Kirk Ruths, MD [facility] [place],  [place] [address]  Chief Complaint  Patient presents with  . Benign Prostatic Hypertrophy    HPI: Patient is an 86 year old male who presents today with interpreter, Ronnald Collum, with a history of elevated PSA and BPH with LU TS with his son, [name].  BPH WITH LUTS His IPSS score today is 25, which is severe lower urinary tract symptomatology.  He is unhappy with his quality life due to his urinary symptoms.  His PVR is 11 mL.  His previous IPSS score was 14/1.   He is currently taking finasteride 5 mg daily and tamsulosin 0.4 mg daily.   His main complaints today are frequency and a weak urinary stream.  Patient denies any modifying or aggravating factors.  Patient denies any gross hematuria, dysuria or suprapubic/flank pain.  Patient denies any fevers, chills, nausea or vomiting.   IPSS    Row Name 03/09/20 1400         International Prostate Symptom Score   How often have you had the sensation of not emptying your bladder?  More than half the time     How often have you had to urinate less than every two hours?  More than half the time     How often have you found you stopped and started again several times when you urinated?  More than half the time     How often have you found it difficult to postpone urination?  About half the time     How often have you had a weak urinary stream?  Almost always     How often have you had to strain to start urination?  Less than 1 in 5 times     How many times did you typically get up at night to urinate?  4 Times     Total IPSS Score  25       Quality of Life due to urinary symptoms   If you were to spend the rest of your life with your urinary condition just the way it is now how would you feel about that?  Unhappy        Score:  1-7 Mild 8-19 Moderate 20-35 Severe  ED Cialis  discontinued by PCP due to polypharmacy.  PMH: Past Medical History:  Diagnosis Date  . BPH (benign prostatic hyperplasia)   . Depression   . Elevated PSA   . Generalized weakness   . Left groin pain   . Left hip pain     Surgical History: Past Surgical History:  Procedure Laterality Date  . cirrhosis    . ESOPHAGOGASTRODUODENOSCOPY (EGD) WITH PROPOFOL N/A 01/19/2020   Procedure: ESOPHAGOGASTRODUODENOSCOPY (EGD) WITH PROPOFOL;  Surgeon: Toledo, Benay Pike, MD;  Location: [facility] ENDOSCOPY;  Service: Gastroenterology;  Laterality: N/A;  . LEG SURGERY  2004   due to accident   this is a knee surgery    Home Medications:  Allergies as of 03/09/2020   No Known Allergies     Medication List       Accurate as of March 09, 2020  3:33 PM. If you have any questions, ask your nurse or doctor.        STOP taking these medications   ondansetron 4 MG disintegrating tablet Commonly known as: Zofran ODT Stopped by: Zara Council, PA-C     TAKE these  medications   acetaminophen 325 MG tablet Commonly known as: TYLENOL Take by mouth. Reported on 04/25/2016   amitriptyline 10 MG tablet Commonly known as: ELAVIL Take 10 mg by mouth at bedtime.   famotidine 20 MG tablet Commonly known as: Pepcid Take 1 tablet (20 mg total) by mouth 2 (two) times daily.   finasteride 5 MG tablet Commonly known as: Proscar Take 1 tablet (5 mg total) by mouth daily.   fluticasone 50 MCG/ACT nasal spray Commonly known as: FLONASE Place into the nose.   Opcon-A 0.027-0.315 % Soln Generic drug: Naphazoline-Pheniramine Apply to eye.   pantoprazole 40 MG tablet Commonly known as: PROTONIX Take by mouth.   risperiDONE 0.25 MG tablet Commonly known as: RISPERDAL Take 0.25 mg by mouth at bedtime.   senna 8.6 MG Tabs tablet Commonly known as: SENOKOT Take 1 tablet by mouth.   tadalafil 5 MG tablet Commonly known as: CIALIS TAKE 1 TABLET BY MOUTH ONCE DAILY AS NEEDED FOR ERECTILE DYSFUNCTION     tamsulosin 0.4 MG Caps capsule Commonly known as: FLOMAX Take 1 capsule (0.4 mg total) by mouth daily.   Up4 Probiotics Ultra Caps Take 1 tablet by mouth 2 (two) times daily.   Vitamin B 12 500 MCG Tabs Take by mouth.       Allergies: No Known Allergies  Family History: Family History  Problem Relation Age of Onset  . Benign prostatic hyperplasia Brother   . Prostate cancer Neg Hx   . Kidney disease Neg Hx   . Kidney cancer Neg Hx   . Bladder Cancer Neg Hx     Social History:  reports that he quit smoking about 56 years ago. He has never used smokeless tobacco. He reports that he does not drink alcohol or use drugs.  ROS: For pertinent review of systems please refer to history of present illness  Physical Exam: BP (!) 160/67   Pulse 97   Ht 5\' 6"  (1.676 m)   Wt 147 lb (66.7 kg)   BMI 23.73 kg/m   Constitutional:  Well nourished. Alert and oriented, No acute distress. HEENT: [place] AT, moist mucus membranes.  Trachea midline, no masses. Cardiovascular: No clubbing, cyanosis, or edema. Respiratory: Normal respiratory effort, no increased work of breathing. GI: Abdomen is soft, non tender, non distended, no abdominal masses. Liver and spleen not palpable.  No hernias appreciated.  Stool sample for occult testing is not indicated.   GU: No CVA tenderness.  No bladder fullness or masses.  Patient with uncircumcised phallus. Foreskin easily retracted  Urethral meatus is patent.  No penile discharge. No penile lesions or rashes. Scrotum without lesions, cysts, rashes and/or edema.  Testicles are located scrotally bilaterally. No masses are appreciated in the testicles. Left and right epididymis are normal.  Small left epididymal cyst palpated. Rectal: Patient with  normal sphincter tone. Anus and perineum without scarring or rashes. No rectal masses are appreciated. Prostate is approximately 60+ grams, could only palpate the apex, no nodules are appreciated. Seminal vesicles could  not be palpated Skin: No rashes, bruises or suspicious lesions. Lymph: No inguinal adenopathy. Neurologic: Grossly intact, no focal deficits, moving all 4 extremities. Psychiatric: Normal mood and affect.   Laboratory Data: Lab Results  Component Value Date   WBC 12.9 (H) 12/19/2019   HGB 14.6 12/19/2019   HCT 41.8 12/19/2019   MCV 85.5 12/19/2019   PLT 215 12/19/2019    Lab Results  Component Value Date   CREATININE 1.16 12/19/2019  PSA  History:  7.9 ng/mL on 04/14/2014  8.3 ng/mL on 06/15/2014  3.9 ng/mL on 02/09/2015  1.9 ng/mL on 02/01/2016  5.79 on 05/21/2018 I have reviewed the labs.   Pertinent imaging Results for JOSON, SAPP (MRN [id number]) as of 03/09/2020 15:35  Ref. Range 03/09/2020 14:33  Scan Result Unknown 0     Assessment & Plan:    1. BPH with LUTS IPSS score is 25/5 , it is worsening Continue conservative management, avoiding bladder irritants and timed voiding's His most bothersome symptoms are nocturia and a weak stream We discussed that he is on maximal medical therapy at this time and we had a lengthy conversation regarding the prostate role in his urinary symptoms and the fact that he has reached the end of pharmacological treatment.  His choices at this time are to continue as is with taking the medications risking further prostate growth and possible urinary retention or undergo a cystoscopy to be evaluated for a bladder outlet procedure.  After this discussion, he stated he was doing better when he was taking the Cialis. We will discontinue the tamsulosin and take the tadalafil 5 mg in its place and he will continue the finasteride 5 mg daily He will return in 3 months for IPSS and PVR  2. ED Will be on a trial of daily Cialis 5 mg daily   Return in about 3 months (around 06/09/2020) for IPSS and PVR.  Michiel Cowboy, PA-C  [facility] [address] [place], [place] [address] [phone]  I  spent 35 minutes on the day of the encounter to include pre-visit record review, face-to-face time with the patient, and post-visit ordering of tests.

## 2020-03-10 ENCOUNTER — Other Ambulatory Visit: Payer: Self-pay | Admitting: Urology

## 2020-03-10 ENCOUNTER — Telehealth: Payer: Self-pay | Admitting: Urology

## 2020-03-10 MED ORDER — TADALAFIL 5 MG PO TABS: ORAL_TABLET | ORAL | 6 refills | Status: DC

## 2020-03-10 MED ORDER — FINASTERIDE 5 MG PO TABS: 5.0000 mg | ORAL_TABLET | Freq: Every day | ORAL | 3 refills | Status: DC

## 2020-03-10 NOTE — Progress Notes (Unsigned)
Please let them know I have sent the prescriptions on to [facility].

## 2020-03-10 NOTE — Telephone Encounter (Signed)
Pt son states he went to pharmacy last evening to pick up Rx's, pharmacy states they never received Rx's [name] asks to have both Rx (wasn't sure of name of Rx) to be sent to [facility] [address]. Please advise.

## 2020-05-29 ENCOUNTER — Other Ambulatory Visit: Payer: Self-pay | Admitting: Gastroenterology

## 2020-05-29 DIAGNOSIS — K7460 Unspecified cirrhosis of liver: Secondary | ICD-10-CM

## 2020-06-15 ENCOUNTER — Other Ambulatory Visit: Payer: Self-pay

## 2020-06-15 ENCOUNTER — Ambulatory Visit
Admission: RE | Admit: 2020-06-15 | Discharge: 2020-06-15 | Disposition: A | Discharge status: Home / Self Care | Payer: Medicare Other | Source: Ambulatory Visit | Attending: Gastroenterology | Admitting: Gastroenterology

## 2020-06-15 DIAGNOSIS — K7460 Unspecified cirrhosis of liver: Secondary | ICD-10-CM | POA: Insufficient documentation

## 2020-06-19 NOTE — Progress Notes (Signed)
2:57 PM   Bradley Wolf 1929-08-28 [id number]  Referring provider: Lauro Regulus, MD [address] [facility] [place],  [place] [address]  Chief Complaint  Patient presents with  . Benign Prostatic Hypertrophy    HPI: Patient is an 86 year old male who presents today with interpreter, [name], with BPH with LU TS with his son, Bradley Wolf.  BPH WITH LUTS His IPSS score today is 23, which is severe lower urinary tract symptomatology.  He is mostly satisfied with his quality life due to his urinary symptoms.  His PVR is 21 mL.  His previous IPSS score was 25/5.  His previous PVR 11 mL.   He is currently taking finasteride 5 mg daily, tadalafil 5 mg daily and tamsulosin 0.4 mg daily.   He is not having any urinary complaints at this visit.  Patient denies any modifying or aggravating factors.  Patient denies any gross hematuria, dysuria or suprapubic/flank pain.  Patient denies any fevers, chills, nausea or vomiting.    IPSS    Row Name 06/20/20 1400         International Prostate Symptom Score   How often have you had the sensation of not emptying your bladder? Almost always     How often have you had to urinate less than every two hours? Almost always     How often have you found you stopped and started again several times when you urinated? Less than half the time     How often have you found it difficult to postpone urination? Not at All     How often have you had a weak urinary stream? More than half the time     How often have you had to strain to start urination? More than half the time     How many times did you typically get up at night to urinate? 3 Times     Total IPSS Score 23       Quality of Life due to urinary symptoms   If you were to spend the rest of your life with your urinary condition just the way it is now how would you feel about that? Mostly Satisfied            Score:  1-7 Mild 8-19 Moderate 20-35 Severe   PMH: Past  Medical History:  Diagnosis Date  . BPH (benign prostatic hyperplasia)   . CKD (chronic kidney disease) stage 3, GFR 30-59 ml/min 03/19/2014  . Depression   . Elevated PSA   . Generalized weakness   . Left groin pain   . Left hip pain     Surgical History: Past Surgical History:  Procedure Laterality Date  . cirrhosis    . ESOPHAGOGASTRODUODENOSCOPY (EGD) WITH PROPOFOL N/A 01/19/2020   Procedure: ESOPHAGOGASTRODUODENOSCOPY (EGD) WITH PROPOFOL;  Surgeon: Toledo, Boykin Nearing, MD;  Location: [facility] ENDOSCOPY;  Service: Gastroenterology;  Laterality: N/A;  . LEG SURGERY  2004   due to accident   this is a knee surgery    Home Medications:  Allergies as of 06/20/2020   No Known Allergies     Medication List       Accurate as of June 20, 2020  2:57 PM. If you have any questions, ask your nurse or doctor.        acetaminophen 325 MG tablet Commonly known as: TYLENOL Take by mouth. Reported on 04/25/2016   amitriptyline 10 MG tablet Commonly known as: ELAVIL Take 10 mg  by mouth at bedtime.   famotidine 20 MG tablet Commonly known as: Pepcid Take 1 tablet (20 mg total) by mouth 2 (two) times daily.   finasteride 5 MG tablet Commonly known as: Proscar Take 1 tablet (5 mg total) by mouth daily.   fluticasone 50 MCG/ACT nasal spray Commonly known as: FLONASE Place into the nose.   Opcon-A 0.027-0.315 % Soln Generic drug: Naphazoline-Pheniramine Apply to eye.   pantoprazole 40 MG tablet Commonly known as: PROTONIX Take by mouth.   risperiDONE 0.25 MG tablet Commonly known as: RISPERDAL Take 0.25 mg by mouth at bedtime.   senna 8.6 MG Tabs tablet Commonly known as: SENOKOT Take 1 tablet by mouth.   tadalafil 5 MG tablet Commonly known as: CIALIS TAKE 1 TABLET BY MOUTH ONCE DAILY   tamsulosin 0.4 MG Caps capsule Commonly known as: FLOMAX Take 1 capsule (0.4 mg total) by mouth daily.   Up4 Probiotics Ultra Caps Take 1 tablet by mouth 2 (two) times daily.     Vitamin B 12 500 MCG Tabs Take by mouth.       Allergies: No Known Allergies  Family History: Family History  Problem Relation Age of Onset  . Benign prostatic hyperplasia Brother   . Prostate cancer Neg Hx   . Kidney disease Neg Hx   . Kidney cancer Neg Hx   . Bladder Cancer Neg Hx     Social History:  reports that he quit smoking about 56 years ago. He has never used smokeless tobacco. He reports that he does not drink alcohol and does not use drugs.  ROS: For pertinent review of systems please refer to history of present illness  Physical Exam: BP (!) 159/73   Pulse 99   Ht 5\' 6"  (1.676 m)   Wt 151 lb (68.5 kg)   BMI 24.37 kg/m   Constitutional:  Well nourished. Alert and oriented, No acute distress. HEENT: [place] AT, mask in place.  Trachea midline Cardiovascular: No clubbing, cyanosis, or edema. Respiratory: Normal respiratory effort, no increased work of breathing. GU: No CVA tenderness.  No bladder fullness or masses.  Patient with uncircumcised phallus.  Foreskin easily retracted  Urethral meatus is patent.  No penile discharge. No penile lesions or rashes. Scrotum without lesions, cysts, rashes and/or edema.  Testicles are located scrotally bilaterally. No masses are appreciated in the testicles. Left and right epididymis are normal. Rectal: Patient with  normal sphincter tone. Anus and perineum without scarring or rashes. No rectal masses are appreciated. Prostate is approximately 60 + grams, could only palpate the apex, no nodules are appreciated. Seminal vesicles could not be palpated.  Skin: No rashes, bruises or suspicious lesions. Lymph: No inguinal adenopathy. Neurologic: Grossly intact, no focal deficits, moving all 4 extremities. Psychiatric: Normal mood and affect.   Laboratory Data: Lab Results  Component Value Date   WBC 12.9 (H) 12/19/2019   HGB 14.6 12/19/2019   HCT 41.8 12/19/2019   MCV 85.5 12/19/2019   PLT 215 12/19/2019    Lab Results   Component Value Date   CREATININE 1.16 12/19/2019  PSA History:  7.9 ng/mL on 04/14/2014  8.3 ng/mL on 06/15/2014  3.9 ng/mL on 02/09/2015  1.9 ng/mL on 02/01/2016  5.79 on 05/21/2018 I have reviewed the labs.   Pertinent imaging Results for SLATE, DEBROUX (MRN Moody Bruins) as of 06/20/2020 14:55  Ref. Range 06/20/2020 14:17  Scan Result Unknown 21    Assessment & Plan:    1. BPH with LUTS  IPSS score is 23/2 , it is improved Continue conservative management, avoiding bladder irritants and timed voiding's Continue finasteride 5 mg daily  and Cialis 5 mg daily -not certain if they discontinue the tamsulosin, but son will go to his home and examine his medication bottles He will return in 12 months for IPS'S, exam and PVR   Return in about 1 year (around 06/20/2021) for IPSS, PVR and exam.  Michiel Cowboy, [facility]  [facility] [address] [place], [place] [address] [phone]

## 2020-06-20 ENCOUNTER — Encounter: Payer: Self-pay | Admitting: Urology

## 2020-06-20 ENCOUNTER — Ambulatory Visit (INDEPENDENT_AMBULATORY_CARE_PROVIDER_SITE_OTHER): Payer: Medicare Other | Admitting: Urology

## 2020-06-20 ENCOUNTER — Other Ambulatory Visit: Payer: Self-pay

## 2020-06-20 VITALS — BP 159/73 | HR 99 | Ht 66.0 in | Wt 151.0 lb

## 2020-06-20 DIAGNOSIS — N138 Other obstructive and reflux uropathy: Secondary | ICD-10-CM | POA: Diagnosis not present

## 2020-06-20 DIAGNOSIS — N401 Enlarged prostate with lower urinary tract symptoms: Secondary | ICD-10-CM

## 2020-06-20 LAB — BLADDER SCAN AMB NON-IMAGING: Scan Result: 21

## 2020-07-13 ENCOUNTER — Ambulatory Visit: Payer: Medicare Other | Admitting: Urology

## 2020-11-21 ENCOUNTER — Emergency Department: Payer: Medicare Other

## 2020-11-21 ENCOUNTER — Emergency Department
Admission: EM | Admit: 2020-11-21 | Discharge: 2020-11-21 | Disposition: A | Discharge status: Home / Self Care | Payer: Medicare Other | Attending: Emergency Medicine | Admitting: Emergency Medicine

## 2020-11-21 ENCOUNTER — Other Ambulatory Visit: Payer: Self-pay

## 2020-11-21 ENCOUNTER — Encounter: Payer: Self-pay | Admitting: Emergency Medicine

## 2020-11-21 DIAGNOSIS — N183 Chronic kidney disease, stage 3 unspecified: Secondary | ICD-10-CM | POA: Diagnosis not present

## 2020-11-21 DIAGNOSIS — R0602 Shortness of breath: Secondary | ICD-10-CM | POA: Diagnosis not present

## 2020-11-21 DIAGNOSIS — R109 Unspecified abdominal pain: Secondary | ICD-10-CM | POA: Insufficient documentation

## 2020-11-21 DIAGNOSIS — G8929 Other chronic pain: Secondary | ICD-10-CM | POA: Diagnosis not present

## 2020-11-21 DIAGNOSIS — Z87891 Personal history of nicotine dependence: Secondary | ICD-10-CM | POA: Diagnosis not present

## 2020-11-21 LAB — COMPREHENSIVE METABOLIC PANEL
ALT: 27 U/L (ref 0–44)
AST: 26 U/L (ref 15–41)
Albumin: 4.1 g/dL (ref 3.5–5.0)
Alkaline Phosphatase: 76 U/L (ref 38–126)
Anion gap: 11 (ref 5–15)
BUN: 20 mg/dL (ref 8–23)
CO2: 22 mmol/L (ref 22–32)
Calcium: 9.5 mg/dL (ref 8.9–10.3)
Chloride: 103 mmol/L (ref 98–111)
Creatinine, Ser: 1.16 mg/dL (ref 0.61–1.24)
GFR, Estimated: >60 mL/min (ref >60)
Glucose, Bld: 143 mg/dL — ABNORMAL HIGH (ref 70–99)
Potassium: 3.7 mmol/L (ref 3.5–5.1)
Sodium: 136 mmol/L (ref 135–145)
Total Bilirubin: 0.6 mg/dL (ref 0.3–1.2)
Total Protein: 8 g/dL (ref 6.5–8.1)

## 2020-11-21 LAB — CBC
HCT: 40.5 % (ref 39.0–52.0)
Hemoglobin: 14.3 g/dL (ref 13.0–17.0)
MCH: 29.3 pg (ref 26.0–34.0)
MCHC: 35.3 g/dL (ref 30.0–36.0)
MCV: 83 fL (ref 80.0–100.0)
Platelets: 202 10*3/uL (ref 150–400)
RBC: 4.88 MIL/uL (ref 4.22–5.81)
RDW: 12.3 % (ref 11.5–15.5)
WBC: 6.4 10*3/uL (ref 4.0–10.5)
nRBC: 0 % (ref 0.0–0.2)

## 2020-11-21 LAB — URINALYSIS, COMPLETE (UACMP) WITH MICROSCOPIC
Bacteria, UA: NONE SEEN
Bilirubin Urine: NEGATIVE
Glucose, UA: NEGATIVE mg/dL
Ketones, ur: NEGATIVE mg/dL
Leukocytes,Ua: NEGATIVE
Nitrite: NEGATIVE
Protein, ur: NEGATIVE mg/dL
Specific Gravity, Urine: 1.017 (ref 1.005–1.030)
Squamous Epithelial / HPF: NONE SEEN (ref 0–5)
pH: 6 (ref 5.0–8.0)

## 2020-11-21 LAB — LIPASE, BLOOD: Lipase: 40 U/L (ref 11–51)

## 2020-11-21 MED ORDER — IOHEXOL 300 MG/ML  SOLN
100.0000 mL | Freq: Once | INTRAMUSCULAR | Status: AC | PRN
  Administered 2020-11-21: 100 mL via INTRAVENOUS

## 2020-11-21 MED ORDER — LIDOCAINE VISCOUS HCL 2 % MT SOLN: 15.0000 mL | OROMUCOSAL | 0 refills | Status: DC | PRN

## 2020-11-21 MED ORDER — LIDOCAINE VISCOUS HCL 2 % MT SOLN
15.0000 mL | Freq: Once | OROMUCOSAL | Status: AC
  Administered 2020-11-21: 15 mL via OROMUCOSAL
  Filled 2020-11-21: qty 15

## 2020-11-21 NOTE — ED Triage Notes (Signed)
Pt sent from [facility] w/son for upper abdominal pain, worsened x 3 days. States this has been ongoing x 1 yr, with negative evaluations from GI docs. No emesis or diarrhea reported. Pt spanish-speaking, son is present and interprets. The note from [facility] states pt is more altered than his normal, but son states he is baseline. Son also reports sob x past few days as well. sats 96% on RA, no distress noted.

## 2020-11-21 NOTE — Discharge Instructions (Addendum)
Please seek medical attention for any high fevers, chest pain, shortness of breath, change in behavior, persistent vomiting, bloody stool or any other new or concerning symptoms.  

## 2020-11-21 NOTE — ED Provider Notes (Signed)
[facility] Emergency Department Provider Note   ____________________________________________   I have reviewed the triage vital signs and the nursing notes.   HISTORY  Chief Complaint Abdominal Pain   History limited by: Language [facility] Interpreter utilized   HPI Bradley Wolf is a 87 y.o. male who presents to the emergency department today because of concern for abdominal pain. The patient has been having issues with abdominal pain for the past year. Has been worked up by outpatient physicians without any clear indication of what is causing the pain. The patient has been having worse pain for the past 3 days. It did keep him up at night last night. Additionally did have some shortness of breath over the past couple of days as well. No fevers. No vomiting or diarrhea.   Records reviewed. Per medical record review patient has a history of chronic abdominal pain.  Past Medical History:  Diagnosis Date  . BPH (benign prostatic hyperplasia)   . CKD (chronic kidney disease) stage 3, GFR 30-59 ml/min (HCC) 03/19/2014  . Depression   . Elevated PSA   . Generalized weakness   . Left groin pain   . Left hip pain     Patient Active Problem List   Diagnosis Date Noted  . Moderate episode of recurrent major depressive disorder (HCC) 10/20/2019  . BPH with obstruction/lower urinary tract symptoms 08/10/2015  . Elevated PSA 08/10/2015  . Mixed hyperlipidemia 03/15/2015  . Moderate mitral insufficiency 03/15/2015  . CKD (chronic kidney disease) stage 3, GFR 30-59 ml/min (HCC) 03/19/2014    Past Surgical History:  Procedure Laterality Date  . cirrhosis    . ESOPHAGOGASTRODUODENOSCOPY (EGD) WITH PROPOFOL N/A 01/19/2020   Procedure: ESOPHAGOGASTRODUODENOSCOPY (EGD) WITH PROPOFOL;  Surgeon: Toledo, Boykin Nearing, MD;  Location: [facility] ENDOSCOPY;  Service: Gastroenterology;  Laterality: N/A;  . LEG SURGERY  2004   due to accident   this is a knee surgery     Prior to Admission medications   Medication Sig Start Date End Date Taking? Authorizing Provider  acetaminophen (TYLENOL) 325 MG tablet Take by mouth. Reported on 04/25/2016    [provider]  amitriptyline (ELAVIL) 10 MG tablet Take 10 mg by mouth at bedtime.    [provider]  Cyanocobalamin (VITAMIN B 12) 500 MCG TABS Take by mouth.    [provider]  famotidine (PEPCID) 20 MG tablet Take 1 tablet (20 mg total) by mouth 2 (two) times daily. 08/21/18   Emily Filbert, MD  finasteride (PROSCAR) 5 MG tablet Take 1 tablet (5 mg total) by mouth daily. 03/10/20   McGowan, Carollee Herter A, PA-C  fluticasone (FLONASE) 50 MCG/ACT nasal spray Place into the nose.    [provider]  Naphazoline-Pheniramine (OPCON-A) 0.027-0.315 % SOLN Apply to eye.    [provider]  pantoprazole (PROTONIX) 40 MG tablet Take by mouth. 04/18/16 03/09/20  [provider]  Probiotic Product (UP4 PROBIOTICS ULTRA) CAPS Take 1 tablet by mouth 2 (two) times daily. 08/21/18   Emily Filbert, MD  risperiDONE (RISPERDAL) 0.25 MG tablet Take 0.25 mg by mouth at bedtime.    [provider]  senna (SENOKOT) 8.6 MG TABS tablet Take 1 tablet by mouth.    [provider]  tadalafil (CIALIS) 5 MG tablet TAKE 1 TABLET BY MOUTH ONCE DAILY 03/10/20   Marvel Plan, Carollee Herter A, PA-C  tamsulosin (FLOMAX) 0.4 MG CAPS capsule Take 1 capsule (0.4 mg total) by mouth daily. 07/14/19  Michiel Cowboy A, PA-C    Allergies Patient has no known allergies.  Family History  Problem Relation Age of Onset  . Benign prostatic hyperplasia Brother   . Prostate cancer Neg Hx   . Kidney disease Neg Hx   . Kidney cancer Neg Hx   . Bladder Cancer Neg Hx     Social History Social History   Tobacco Use  . Smoking status: Former Smoker    Quit date: 1965    Years since quitting: 57.1  . Smokeless tobacco: Never Used  Vaping Use  . Vaping Use: Never used  Substance Use  Topics  . Alcohol use: No    Alcohol/week: 0.0 standard drinks  . Drug use: No    Review of Systems Constitutional: No fever/chills Eyes: No visual changes. ENT: No sore throat. Cardiovascular: Denies chest pain. Respiratory: Positive for shortness of breath. Gastrointestinal: Positive for abdominal pain and nausea.  Genitourinary: Negative for dysuria. Musculoskeletal: Negative for back pain. Skin: Negative for rash. Neurological: Negative for headaches, focal weakness or numbness.  ____________________________________________   PHYSICAL EXAM:  VITAL SIGNS: ED Triage Vitals [11/21/20 0851]  Enc Vitals Group     BP (!) 154/70     Pulse Rate 78     Resp 18     Temp 98.6 F (37 C)     Temp Source Oral     SpO2 96 %     Weight 150 lb (68 kg)   Constitutional: Alert and oriented.  Eyes: Conjunctivae are normal.  ENT      Head: Normocephalic and atraumatic.      Nose: No congestion/rhinnorhea.      Mouth/Throat: Mucous membranes are moist.      Neck: No stridor. Hematological/Lymphatic/Immunilogical: No cervical lymphadenopathy. Cardiovascular: Normal rate, regular rhythm.  No murmurs, rubs, or gallops.  Respiratory: Normal respiratory effort without tachypnea nor retractions. Breath sounds are clear and equal bilaterally. No wheezes/rales/rhonchi. Gastrointestinal: Soft and non tender. No rebound. No guarding.  Genitourinary: Deferred Musculoskeletal: Normal range of motion in all extremities. No lower extremity edema. Neurologic:  Normal speech and language. No gross focal neurologic deficits are appreciated.  Skin:  Skin is warm, dry and intact. No rash noted. Psychiatric: Mood and affect are normal. Speech and behavior are normal. Patient exhibits appropriate insight and judgment.  ____________________________________________    LABS (pertinent positives/negatives)  CMP wnl except glu 143 CBC wbc 6.4, hgb 14.3, plt 202 Lipase 40 UA clear, small hgb dipstick,  0-5 rbc and wbc  ____________________________________________   EKG  I, Phineas Semen, attending physician, personally viewed and interpreted this EKG  EKG Time: 0850 Rate: 80 Rhythm: normal sinus rhythm Axis: left axis deviation Intervals: qtc 463 QRS: RBBB, LAFB ST changes: no st elevation Impression: abnormal ekg  ____________________________________________    RADIOLOGY  CXR Low lung volumes, question some scarring in left lower lung  CT ab/pel No acute abnormality ____________________________________________   PROCEDURES  Procedures  ____________________________________________   INITIAL IMPRESSION / ASSESSMENT AND PLAN / ED COURSE  Pertinent labs & imaging results that were available during my care of the patient were reviewed by me and considered in my medical decision making (see chart for details).   Patient presented to the ER because of concern for worsening chronic abdominal pain. The pain had been worse over the past three days. On exam abdomen is soft and non tender. Blood work without concerning leukocytosis or abdnormality. However given worsening pain did have concern to get a CT abd/pel.  This did not show any acute abnormality. The patient was given viscous lidocaine and did feel some improvement. Will plan on discharging with the same. Did discuss follow up.  ____________________________________________   FINAL CLINICAL IMPRESSION(S) / ED DIAGNOSES  Final diagnoses:  Abdominal pain, unspecified abdominal location     Note: This dictation was prepared with Dragon dictation. Any transcriptional errors that result from this process are unintentional     Phineas Semen, MD 11/21/20 1456

## 2020-12-26 ENCOUNTER — Telehealth: Payer: Self-pay

## 2020-12-26 NOTE — Telephone Encounter (Signed)
Incoming call from interpreter at [facility] who states she was contacted by the patient and his son to request refills on medications that we prescribe. Called pt and son back using interpreter services. No answer. LM for pt and son to call back. Called [facility] interpreter back, let her know that I LM with Ramos family. Informed [facility] interpreter of names and dosage of all urologic prescribed medications.

## 2020-12-28 ENCOUNTER — Telehealth: Payer: Self-pay

## 2020-12-28 MED ORDER — TAMSULOSIN HCL 0.4 MG PO CAPS: 0.4000 mg | ORAL_CAPSULE | Freq: Every day | ORAL | 3 refills | Status: DC

## 2020-12-28 NOTE — Telephone Encounter (Signed)
Pt son called asking for refill on flomax

## 2020-12-29 ENCOUNTER — Other Ambulatory Visit: Payer: Self-pay | Admitting: Urology

## 2021-06-19 NOTE — Progress Notes (Addendum)
11:34 AM   Bradley Wolf [birth year] [id number]  Referring provider: Kirk Ruths, MD [facility] [place],  [place] [address]  Urological history: 1. BPH with LU TS -I PSS 0/2 -managed with finasteride 5 mg daily, tamsulosin 0.4 mg daily and tadalafil 5 mg daily  Chief Complaint  Patient presents with   Benign Prostatic Hypertrophy     HPI: Bradley Wolf is a 87 y.o. male who presents today for yearly follow up with his son, Bradley Wolf and interpreter, Bradley Wolf #[id number]  He states his urinary stream is slow and weak.  He states that his bladder empties completely.    Patient denies any modifying or aggravating factors.  Patient denies any gross hematuria, dysuria or suprapubic/flank pain.  Patient denies any fevers, chills, nausea or vomiting.     IPSS     Row Name 06/20/21 1100         International Prostate Symptom Score   How often have you had the sensation of not emptying your bladder? Not at All     How often have you had to urinate less than every two hours? Not at All     How often have you found you stopped and started again several times when you urinated? Not at All     How often have you found it difficult to postpone urination? Not at All     How often have you had a weak urinary stream? Not at All     How often have you had to strain to start urination? Not at All     How many times did you typically get up at night to urinate? None     Total IPSS Score 0           Quality of Life due to urinary symptoms   If you were to spend the rest of your life with your urinary condition just the way it is now how would you feel about that? Mostly Satisfied               Score:  1-7 Mild 8-19 Moderate 20-35 Severe   PMH: Past Medical History:  Diagnosis Date   BPH (benign prostatic hyperplasia)    CKD (chronic kidney disease) stage 3, GFR 30-59 ml/min (HCC) 03/19/2014   Depression    Elevated PSA     Generalized weakness    Left groin pain    Left hip pain     Surgical History: Past Surgical History:  Procedure Laterality Date   cirrhosis     ESOPHAGOGASTRODUODENOSCOPY (EGD) WITH PROPOFOL N/A 01/19/2020   Procedure: ESOPHAGOGASTRODUODENOSCOPY (EGD) WITH PROPOFOL;  Surgeon: Toledo, Benay Pike, MD;  Location: [facility] ENDOSCOPY;  Service: Gastroenterology;  Laterality: N/A;   LEG SURGERY  2004   due to accident   this is a knee surgery    Home Medications:  Allergies as of 06/20/2021   No Known Allergies      Medication List        Accurate as of June 20, 2021 11:34 AM. If you have any questions, ask your nurse or doctor.          acetaminophen 325 MG tablet Commonly known as: TYLENOL Take by mouth. Reported on 04/25/2016   amitriptyline 10 MG tablet Commonly known as: ELAVIL Take 10 mg by mouth at bedtime.   famotidine 20 MG tablet Commonly known as: Pepcid Take 1 tablet (20 mg total) by mouth  2 (two) times daily.   finasteride 5 MG tablet Commonly known as: Proscar Take 1 tablet (5 mg total) by mouth daily.   fluticasone 50 MCG/ACT nasal spray Commonly known as: FLONASE Place into the nose.   lidocaine 2 % solution Commonly known as: XYLOCAINE Use as directed 15 mLs in the mouth or throat every 4 (four) hours as needed (abd pain).   Opcon-A 0.027-0.315 % Soln Generic drug: Naphazoline-Pheniramine Apply to eye.   pantoprazole 40 MG tablet Commonly known as: PROTONIX Take by mouth.   risperiDONE 0.25 MG tablet Commonly known as: RISPERDAL Take 0.25 mg by mouth at bedtime.   senna 8.6 MG Tabs tablet Commonly known as: SENOKOT Take 1 tablet by mouth.   tadalafil 5 MG tablet Commonly known as: CIALIS TAKE ONE TABLET BY MOUTH DAILY   tamsulosin 0.4 MG Caps capsule Commonly known as: FLOMAX Take 1 capsule (0.4 mg total) by mouth daily.   Up4 Probiotics Ultra Caps Take 1 tablet by mouth 2 (two) times daily.   Vitamin B 12 500 MCG Tabs Take  by mouth.        Allergies: No Known Allergies  Family History: Family History  Problem Relation Age of Onset   Benign prostatic hyperplasia Brother    Prostate cancer Neg Hx    Kidney disease Neg Hx    Kidney cancer Neg Hx    Bladder Cancer Neg Hx     Social History:  reports that he quit smoking about 57 years ago. His smoking use included cigarettes. He has never used smokeless tobacco. He reports that he does not drink alcohol and does not use drugs.  ROS: For pertinent review of systems please refer to history of present illness  Physical Exam: BP (!) 167/69   Pulse 88   Ht '5\' 4"'  (1.626 m)   Wt 153 lb (69.4 kg)   BMI 26.26 kg/m   Constitutional:  Well nourished. Alert and oriented, No acute distress. HEENT: [place] AT, mask in place.  Trachea midline Cardiovascular: No clubbing, cyanosis, or edema. Respiratory: Normal respiratory effort, no increased work of breathing. Neurologic: Grossly intact, no focal deficits, moving all 4 extremities. Psychiatric: Normal mood and affect.   Laboratory Data: Hemoglobin A1C 4.2 - 5.6 % 6.2 High    Average Blood Glucose (Calc) mg/dL 131   [place] - LAB  Narrative Performed by [facility] - LAB Normal Range:    4.2 - 5.6%  Increased Risk:  5.7 - 6.4%  Diabetes:        >= 6.5%  Glycemic Control for adults with diabetes:  <7%   Specimen Collected: 05/09/21 13:38 Last Resulted: 05/09/21 14:43  Received From: [place]  Result Received: 06/19/21 13:27   Cholesterol, Total 100 - 200 mg/dL 183   Triglyceride 35 - 199 mg/dL 254 High    HDL (High Density Lipoprotein) Cholesterol 29.0 - 71.0 mg/dL 39.0   LDL Calculated 0 - 130 mg/dL 93   VLDL Cholesterol mg/dL 51   Cholesterol/HDL Ratio  4.7   Resulting Agency  [place] - LAB  Specimen Collected: 05/09/21 13:38 Last Resulted: 05/09/21 16:01  Received From: [place]  Result Received: 06/19/21  13:27   Glucose 70 - 110 mg/dL 105   Sodium 136 - 145 mmol/L 137   Potassium 3.6 - 5.1 mmol/L 4.1   Chloride 97 - 109 mmol/L 102   Carbon Dioxide (CO2) 22.0 - 32.0 mmol/L 29.7  Urea Nitrogen (BUN) 7 - 25 mg/dL 18   Creatinine 0.7 - 1.3 mg/dL 1.1   Glomerular Filtration Rate (eGFR), MDRD Estimate >60 mL/min/1.73sq m 63   Calcium 8.7 - 10.3 mg/dL 9.6   AST  8 - 39 U/L 17   ALT  6 - 57 U/L 17   Alk Phos (alkaline Phosphatase) 34 - 104 U/L 80   Albumin 3.5 - 4.8 g/dL 4.2   Bilirubin, Total 0.3 - 1.2 mg/dL 0.4   Protein, Total 6.1 - 7.9 g/dL 7.4   A/G Ratio 1.0 - 5.0 gm/dL 1.3   Resulting Agency  [place] - LAB  Specimen Collected: 05/09/21 13:38 Last Resulted: 05/09/21 16:03  Received From: [place]  Result Received: 06/19/21 13:27  I have reviewed the labs.   Pertinent imaging N/A   Assessment & Plan:    1. BPH with LUTS -symptoms - weak urinary stream -continue conservative management, avoiding bladder irritants and timed voiding's -continue tamsulosin 0.4 mg daily, finasteride 5 mg daily and tadalafil 5 mg daily    Return in about 1 year (around 06/20/2022) for I PSS and follow up .  Zara Council, PA-C  [facility] [address] [place], [place] [address] [phone]

## 2021-06-20 ENCOUNTER — Other Ambulatory Visit: Payer: Self-pay

## 2021-06-20 ENCOUNTER — Encounter: Payer: Self-pay | Admitting: Urology

## 2021-06-20 ENCOUNTER — Ambulatory Visit (INDEPENDENT_AMBULATORY_CARE_PROVIDER_SITE_OTHER): Payer: Medicare Other | Admitting: Urology

## 2021-06-20 VITALS — BP 167/69 | HR 88 | Ht 64.0 in | Wt 153.0 lb

## 2021-06-20 DIAGNOSIS — N138 Other obstructive and reflux uropathy: Secondary | ICD-10-CM

## 2021-06-20 DIAGNOSIS — N401 Enlarged prostate with lower urinary tract symptoms: Secondary | ICD-10-CM | POA: Diagnosis not present

## 2021-12-13 ENCOUNTER — Other Ambulatory Visit: Payer: Self-pay | Admitting: Urology

## 2022-01-27 IMAGING — CT CT ABD-PELV W/ CM
2 of 5 series · 16 of 46 positions shown, 18 images · IV contrast (APPLIED)
Comparison: Ultrasound from 12/03/2019

CLINICAL DATA: Abdominal distension and pain for several weeks

EXAM:
CT ABDOMEN AND PELVIS WITH CONTRAST
TECHNIQUE: Multidetector CT imaging of the abdomen and pelvis was performed
using the standard protocol following bolus administration of
intravenous contrast.
CONTRAST:  100mL OMNIPAQUE IOHEXOL 300 MG/ML  SOLN

[Series 2: routine abd/pel with · axial · 0.82mm/px · z∈[-1051,-646]mm · 13 of 93 slices shown, 15 images]
[im 6/93  soft-tissue]
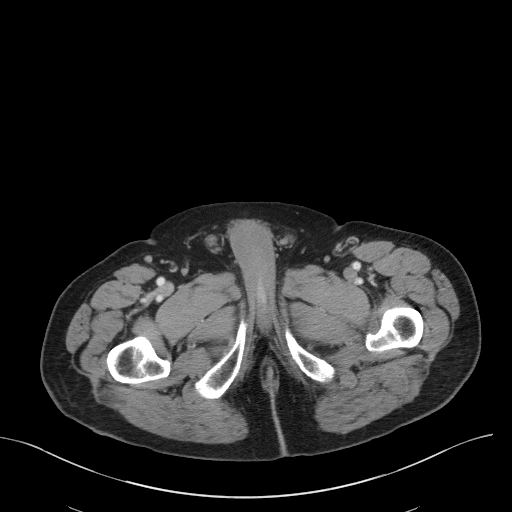
[im 6/93  bone]
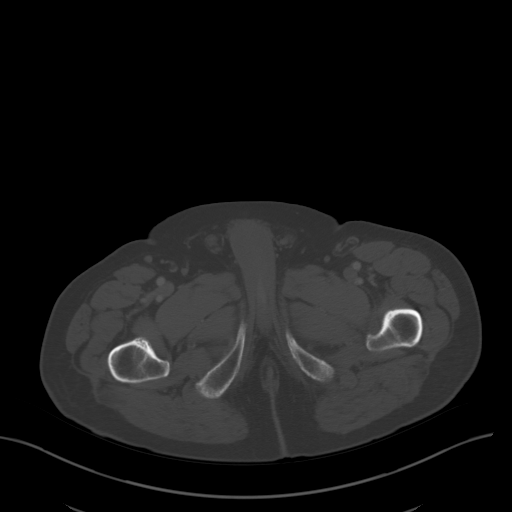
[im 11/93  soft-tissue]
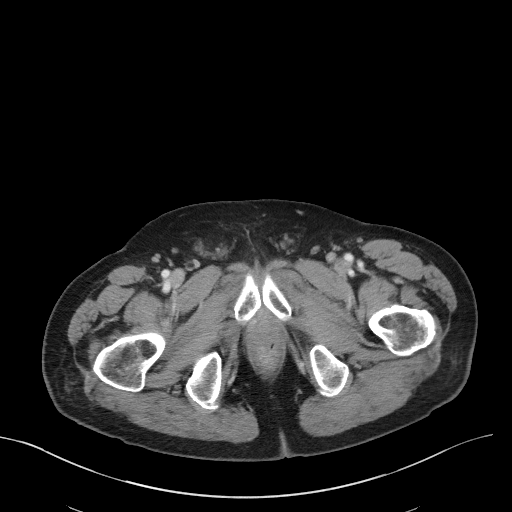
[im 21/93  soft-tissue]
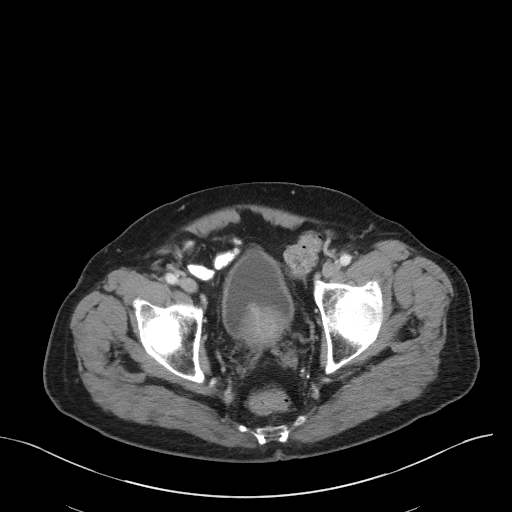
[im 26/93  soft-tissue]
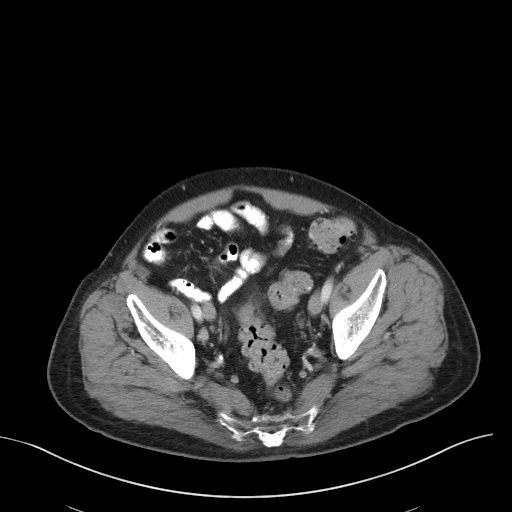
[im 31/93  soft-tissue]
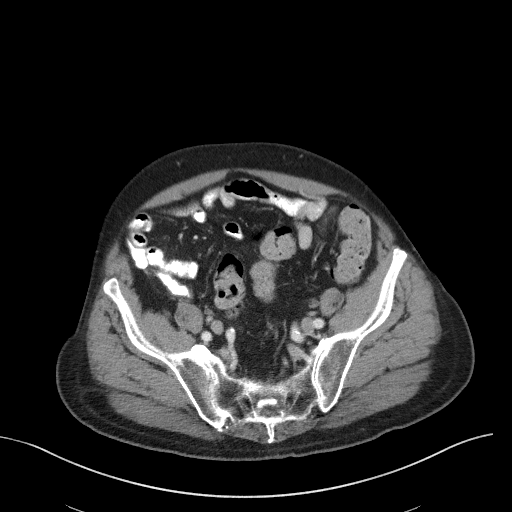
[im 41/93  soft-tissue]
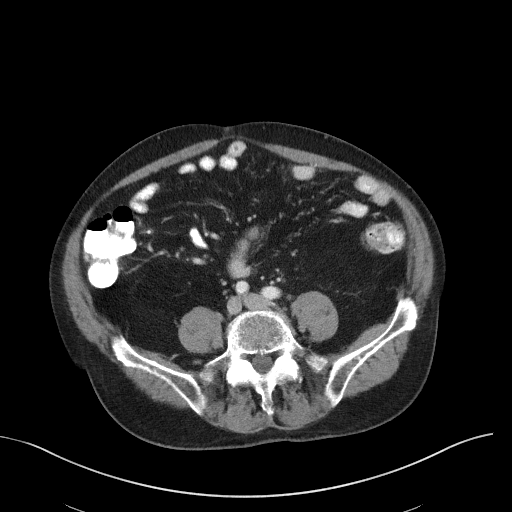
[im 47/93  soft-tissue]
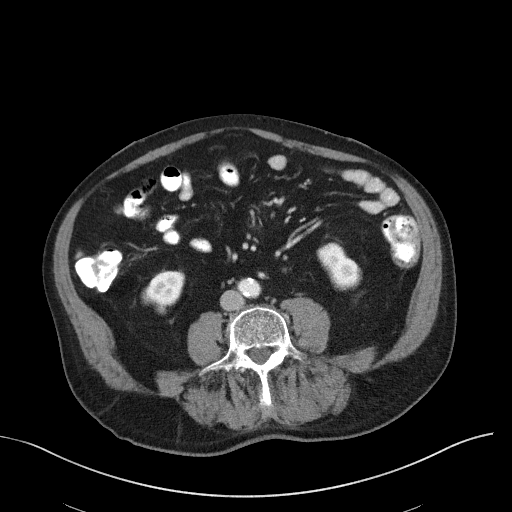
[im 52/93  soft-tissue]
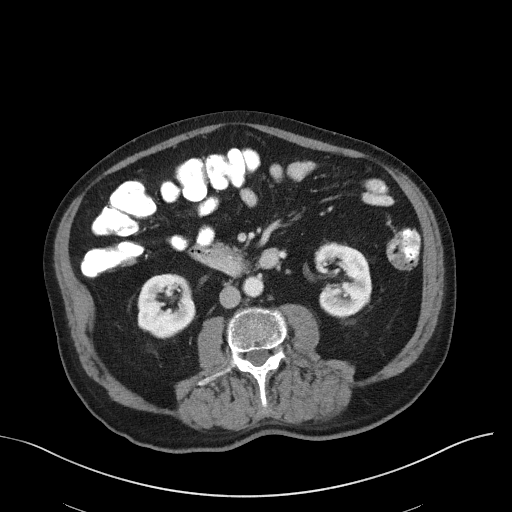
[im 62/93  soft-tissue]
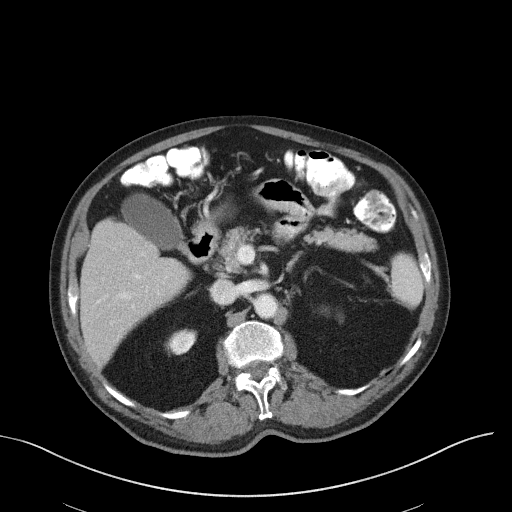
[im 62/93  bone]
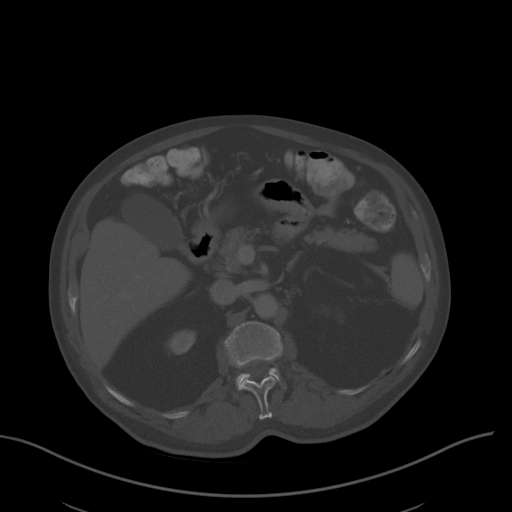
[im 67/93  soft-tissue]
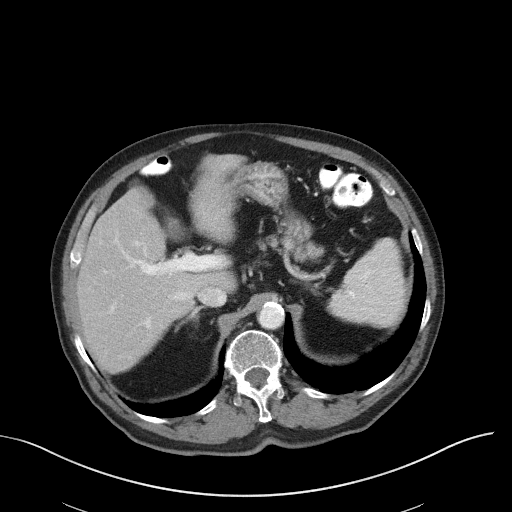
[im 72/93  soft-tissue]
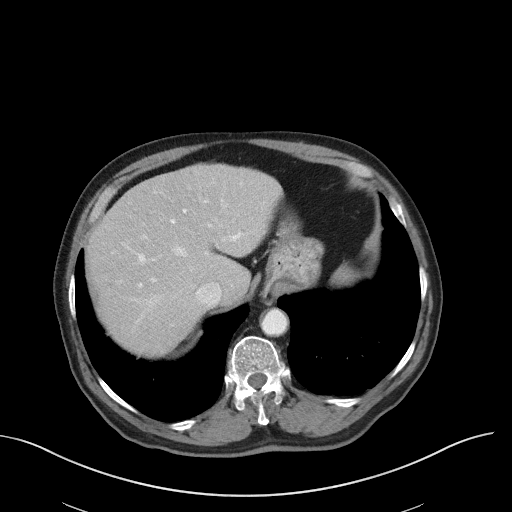
[im 82/93  soft-tissue]
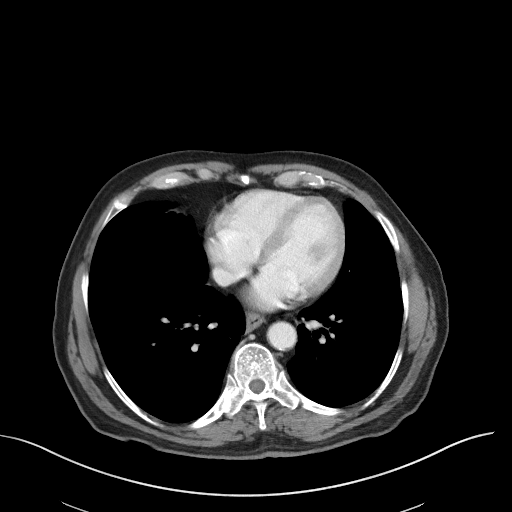
[im 87/93  soft-tissue]
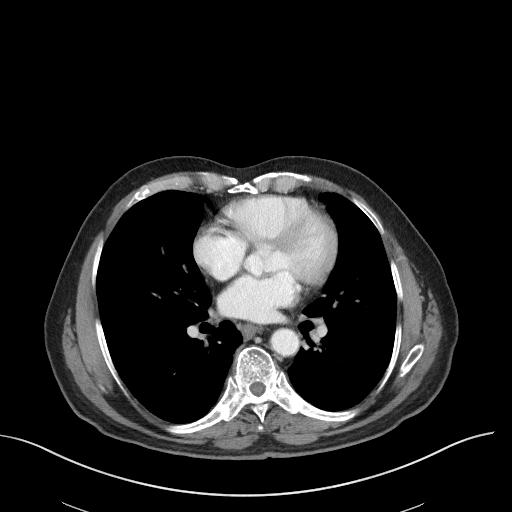

[Series 5: coronal st · coronal · 0.71mm/px · 3 of 91 slices shown]
[im 31/91  soft-tissue]
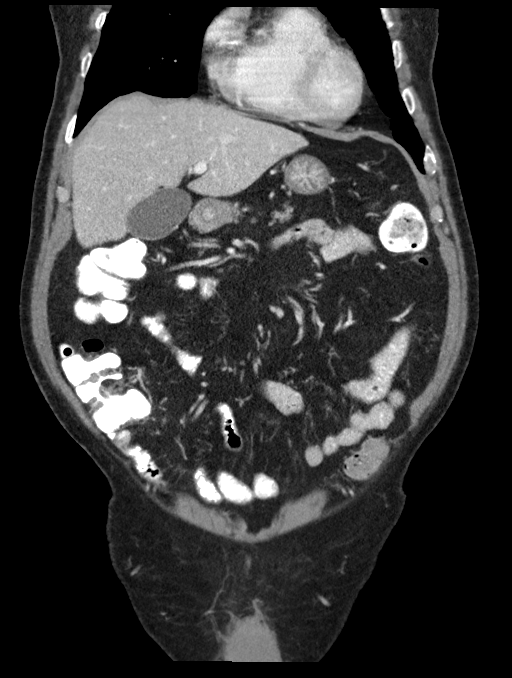
[im 41/91  soft-tissue]
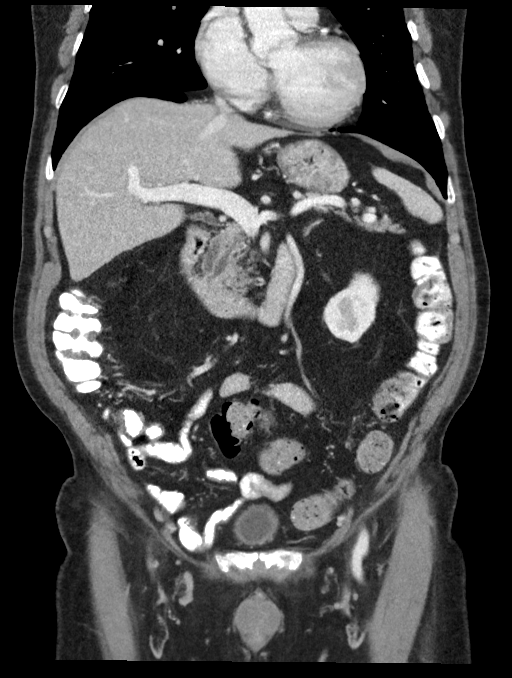
[im 51/91  soft-tissue]
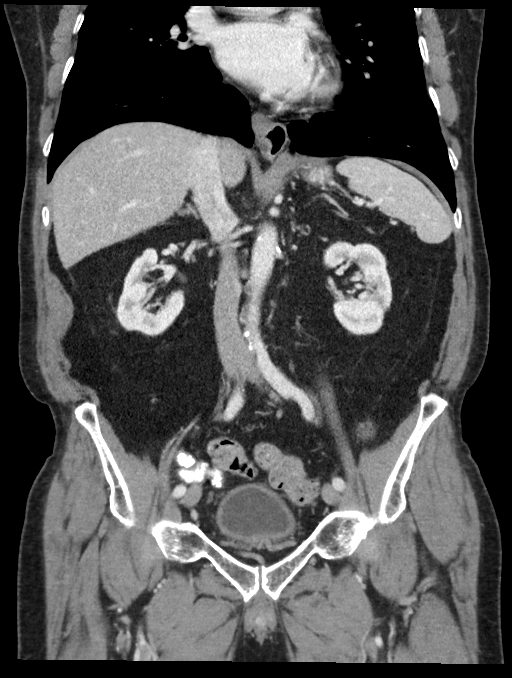

[16 of 46 positions shown; findings below may reference images not displayed]

FINDINGS: Lower chest: No acute abnormality.

Hepatobiliary: Mild fatty infiltration of the liver is noted. No
focal mass is seen. The gallbladder is within normal limits.

Pancreas: Unremarkable. No pancreatic ductal dilatation or
surrounding inflammatory changes.

Spleen: Normal in size without focal abnormality.

Adrenals/Urinary Tract: Adrenal glands are within normal limits.
Kidneys demonstrate a normal enhancement pattern bilaterally. No
obstructive changes are seen. The ureters are within normal limits.
The bladder is decompressed.

Stomach/Bowel: The appendix is within normal limits. Colon shows no
obstructive or inflammatory changes. Visualized small bowel is
within normal limits. Small sliding-type hiatal hernia is noted.

Vascular/Lymphatic: Aortic calcifications are noted without
aneurysmal dilatation. No sizable adenopathy is seen.

Reproductive: Prostate is enlarged in size.

Other: Fat containing right inguinal hernia is again identified and
stable.

Musculoskeletal: Degenerative changes of lumbar spine are seen.
IMPRESSION: Chronic changes without acute abnormality.

## 2022-05-09 ENCOUNTER — Other Ambulatory Visit: Payer: Self-pay

## 2022-05-09 ENCOUNTER — Emergency Department
Admission: EM | Admit: 2022-05-09 | Discharge: 2022-05-09 | Disposition: A | Discharge status: Home / Self Care | Payer: Medicare Other | Attending: Emergency Medicine | Admitting: Emergency Medicine

## 2022-05-09 ENCOUNTER — Emergency Department: Payer: Medicare Other

## 2022-05-09 DIAGNOSIS — K8020 Calculus of gallbladder without cholecystitis without obstruction: Secondary | ICD-10-CM | POA: Insufficient documentation

## 2022-05-09 DIAGNOSIS — J810 Acute pulmonary edema: Secondary | ICD-10-CM | POA: Diagnosis not present

## 2022-05-09 DIAGNOSIS — R1084 Generalized abdominal pain: Secondary | ICD-10-CM

## 2022-05-09 DIAGNOSIS — E782 Mixed hyperlipidemia: Secondary | ICD-10-CM | POA: Insufficient documentation

## 2022-05-09 DIAGNOSIS — Z20822 Contact with and (suspected) exposure to covid-19: Secondary | ICD-10-CM | POA: Insufficient documentation

## 2022-05-09 DIAGNOSIS — N400 Enlarged prostate without lower urinary tract symptoms: Secondary | ICD-10-CM | POA: Diagnosis not present

## 2022-05-09 DIAGNOSIS — I2510 Atherosclerotic heart disease of native coronary artery without angina pectoris: Secondary | ICD-10-CM | POA: Diagnosis not present

## 2022-05-09 DIAGNOSIS — I700 Atherosclerosis of aorta: Secondary | ICD-10-CM | POA: Diagnosis not present

## 2022-05-09 DIAGNOSIS — K449 Diaphragmatic hernia without obstruction or gangrene: Secondary | ICD-10-CM | POA: Insufficient documentation

## 2022-05-09 DIAGNOSIS — R112 Nausea with vomiting, unspecified: Secondary | ICD-10-CM | POA: Diagnosis present

## 2022-05-09 DIAGNOSIS — K529 Noninfective gastroenteritis and colitis, unspecified: Secondary | ICD-10-CM | POA: Insufficient documentation

## 2022-05-09 LAB — GASTROINTESTINAL PANEL BY PCR, STOOL (REPLACES STOOL CULTURE)

## 2022-05-09 LAB — COMPREHENSIVE METABOLIC PANEL
ALT: 20 U/L (ref 0–44)
AST: 22 U/L (ref 15–41)
Albumin: 4.2 g/dL (ref 3.5–5.0)
Alkaline Phosphatase: 60 U/L (ref 38–126)
Anion gap: 9 (ref 5–15)
BUN: 21 mg/dL (ref 8–23)
CO2: 21 mmol/L — ABNORMAL LOW (ref 22–32)
Calcium: 9.3 mg/dL (ref 8.9–10.3)
Chloride: 107 mmol/L (ref 98–111)
Creatinine, Ser: 1.23 mg/dL (ref 0.61–1.24)
GFR, Estimated: 56 mL/min — ABNORMAL LOW (ref >60)
Glucose, Bld: 111 mg/dL — ABNORMAL HIGH (ref 70–99)
Potassium: 4.2 mmol/L (ref 3.5–5.1)
Sodium: 137 mmol/L (ref 135–145)
Total Bilirubin: 0.7 mg/dL (ref 0.3–1.2)
Total Protein: 7.8 g/dL (ref 6.5–8.1)

## 2022-05-09 LAB — CBC WITH DIFFERENTIAL/PLATELET
Abs Immature Granulocytes: 0.02 10*3/uL (ref 0.00–0.07)
Basophils Absolute: 0 10*3/uL (ref 0.0–0.1)
Basophils Relative: 0 %
Eosinophils Absolute: 0.1 10*3/uL (ref 0.0–0.5)
Eosinophils Relative: 1 %
HCT: 40 % (ref 39.0–52.0)
Hemoglobin: 13.6 g/dL (ref 13.0–17.0)
Immature Granulocytes: 0 %
Lymphocytes Relative: 17 %
Lymphs Abs: 1.7 10*3/uL (ref 0.7–4.0)
MCH: 28.9 pg (ref 26.0–34.0)
MCHC: 34 g/dL (ref 30.0–36.0)
MCV: 85.1 fL (ref 80.0–100.0)
Monocytes Absolute: 0.8 10*3/uL (ref 0.1–1.0)
Monocytes Relative: 8 %
Neutro Abs: 7.5 10*3/uL (ref 1.7–7.7)
Neutrophils Relative %: 74 %
Platelets: 214 10*3/uL (ref 150–400)
RBC: 4.7 MIL/uL (ref 4.22–5.81)
RDW: 13.1 % (ref 11.5–15.5)
WBC: 10.1 10*3/uL (ref 4.0–10.5)
nRBC: 0 % (ref 0.0–0.2)

## 2022-05-09 LAB — C DIFFICILE QUICK SCREEN W PCR REFLEX
C Diff antigen: NEGATIVE
C Diff interpretation: NOT DETECTED
C Diff toxin: NEGATIVE

## 2022-05-09 LAB — TROPONIN I (HIGH SENSITIVITY)
Troponin I (High Sensitivity): 8 ng/L (ref <18)
Troponin I (High Sensitivity): 10 ng/L (ref <18)

## 2022-05-09 LAB — URINALYSIS, ROUTINE W REFLEX MICROSCOPIC
Bilirubin Urine: NEGATIVE
Glucose, UA: NEGATIVE mg/dL
Hgb urine dipstick: NEGATIVE
Ketones, ur: NEGATIVE mg/dL
Leukocytes,Ua: NEGATIVE
Nitrite: NEGATIVE
Protein, ur: NEGATIVE mg/dL
Specific Gravity, Urine: 1.009 (ref 1.005–1.030)
pH: 7 (ref 5.0–8.0)

## 2022-05-09 LAB — BRAIN NATRIURETIC PEPTIDE: B Natriuretic Peptide: 26.8 pg/mL (ref 0.0–100.0)

## 2022-05-09 LAB — SARS CORONAVIRUS 2 BY RT PCR: SARS Coronavirus 2 by RT PCR: NEGATIVE

## 2022-05-09 LAB — LIPASE, BLOOD: Lipase: 35 U/L (ref 11–51)

## 2022-05-09 LAB — MAGNESIUM: Magnesium: 2 mg/dL (ref 1.7–2.4)

## 2022-05-09 MED ORDER — ONDANSETRON HCL 4 MG/2ML IJ SOLN
4.0000 mg | Freq: Once | INTRAMUSCULAR | Status: AC
  Administered 2022-05-09: 4 mg via INTRAVENOUS
  Filled 2022-05-09: qty 2

## 2022-05-09 MED ORDER — ALUM & MAG HYDROXIDE-SIMETH 200-200-20 MG/5ML PO SUSP
30.0000 mL | Freq: Once | ORAL | Status: AC
  Administered 2022-05-09: 30 mL via ORAL
  Filled 2022-05-09: qty 30

## 2022-05-09 MED ORDER — PANTOPRAZOLE SODIUM 40 MG IV SOLR
40.0000 mg | Freq: Once | INTRAVENOUS | Status: AC
  Administered 2022-05-09: 40 mg via INTRAVENOUS
  Filled 2022-05-09: qty 10

## 2022-05-09 MED ORDER — ONDANSETRON 4 MG PO TBDP: 4.0000 mg | ORAL_TABLET | Freq: Three times a day (TID) | ORAL | 0 refills | Status: AC | PRN

## 2022-05-09 MED ORDER — IOHEXOL 300 MG/ML  SOLN
100.0000 mL | Freq: Once | INTRAMUSCULAR | Status: AC | PRN
  Administered 2022-05-09: 100 mL via INTRAVENOUS

## 2022-05-09 NOTE — ED Triage Notes (Signed)
Pt in from [facility] due to N/V/D since last night; 2 times vomited and diarrhea x1 every hour. Pt denies any other symptoms. Abdominal pain at lower center abd.

## 2022-05-09 NOTE — Discharge Instructions (Addendum)
For your incidental findings I have placed a cardiology referral.  There is also some gallstones noted on your ultrasound he can follow-up with surgery for this if he develops fever or worsening pain in his abdomen he needs to come back for recheck.  You can call to schedule up a follow-up appointment.  Try to avoid fatty foods.  However I suspect that today was most likely related to what he ate and I prescribe some Zofran to help with any nausea.  IMPRESSION: 1. No acute CT findings of the abdomen or pelvis to explain abdominal pain or vomiting. 2. Small hiatal hernia. 3. Prostatomegaly. 4. Coronary artery disease.

## 2022-05-09 NOTE — ED Provider Notes (Signed)
[facility] Provider Note    Event Date/Time   First MD Initiated Contact with Patient 05/09/22 1012     (approximate)   History   Abdominal Pain   HPI  Bradley Wolf is a 88 y.o. male who comes in with nausea vomiting diarrhea that started last night.  Patient had 2 episodes of vomiting and multiple episodes of vomiting every hour since this morning.  They are unclear if there is been any recent antibiotics.  He is reporting pain in his upper abdomen.  He denies any chest pain or shortness of breath.  Denies any blood in the stool.  Denies any falls or hitting his head.  Patient son is at bedside and declined Spanish interpreter.   Physical Exam   Triage Vital Signs: ED Triage Vitals  Enc Vitals Group     BP 05/09/22 0958 (!) 152/66     Pulse Rate 05/09/22 0958 93     Resp 05/09/22 0958 18     Temp 05/09/22 0958 98 F (36.7 C)     Temp Source 05/09/22 0958 Oral     SpO2 05/09/22 0958 98 %     Weight 05/09/22 1007 154 lb (69.9 kg)     Height 05/09/22 1007 5\' 4"  (1.626 m)     Head Circumference --      Peak Flow --      Pain Score 05/09/22 1007 5     Pain Loc --      Pain Edu? --      Excl. in GC? --     Most recent vital signs: Vitals:   05/09/22 0958  BP: (!) 152/66  Pulse: 93  Resp: 18  Temp: 98 F (36.7 C)  SpO2: 98%     General: Awake, no distress.  CV:  Good peripheral perfusion.  Resp:  Normal effort.  Abd:  No distention.  Tender in the upper abdomen Other:     ED Results / Procedures / Treatments   Labs (all labs ordered are listed, but only abnormal results are displayed) Labs Reviewed  GASTROINTESTINAL PANEL BY PCR, STOOL (REPLACES STOOL CULTURE)  C DIFFICILE QUICK SCREEN W PCR REFLEX    SARS CORONAVIRUS 2 BY RT PCR  CBC WITH DIFFERENTIAL/PLATELET  COMPREHENSIVE METABOLIC PANEL  LIPASE, BLOOD  URINALYSIS, ROUTINE W REFLEX MICROSCOPIC  MAGNESIUM  TROPONIN I (HIGH SENSITIVITY)     EKG  My  interpretation of EKG:  Normal sinus rate of 87 without any ST elevation, T wave inversion in V2 with a right bundle branch block and left anterior fascicular block  RADIOLOGY I have reviewed the xray personally and interpreted and some possible atelectasis versus mild edema in the bases  PROCEDURES:  Critical Care performed: No  .1-3 Lead EKG Interpretation  Performed by: 07/09/22, MD Authorized by: Concha Se, MD     Interpretation: normal     ECG rate:  70   ECG rate assessment: normal     Rhythm: sinus rhythm     Ectopy: none     Conduction: normal      MEDICATIONS ORDERED IN ED: Medications  ondansetron (ZOFRAN) injection 4 mg (4 mg Intravenous Given 05/09/22 1046)  pantoprazole (PROTONIX) injection 40 mg (40 mg Intravenous Given 05/09/22 1046)  alum & mag hydroxide-simeth (MAALOX/MYLANTA) 200-200-20 MG/5ML suspension 30 mL (30 mLs Oral Given 05/09/22 1046)     IMPRESSION / MDM / ASSESSMENT AND PLAN / ED COURSE  I reviewed  the triage vital signs and the nursing notes.   Patient's presentation is most consistent with acute presentation with potential threat to life or bodily function.   Differential includes gastritis, cholecystitis, gastroenteritis, electrolyte abnormalities.  We will proceed with ultrasound to rule out gallstones, Zofran, Protonix and GI cocktail.  Given the copious amounts of stooling and patient's age will get stool studies and C. difficile testing.  Troponin is negative x2.  Urine without evidence of UTI.  COVID-negative, CBC reassuring.  CMP reassuring lipase normal stool studies are negative.  On repeat assessment patient continues to have abdominal discomfort.  We have discussed CT imaging and they would like to proceed.  If negative I suspect this is more likely related to his chronic abdominal pain and can be followed up outpatient but given patient's age and comorbidities I think would be best to make sure there is no underlying issues.      IMPRESSION: 1. No acute CT findings of the abdomen or pelvis to explain abdominal pain or vomiting. 2. Small hiatal hernia. 3. Prostatomegaly. 4. Coronary artery disease.  Using interpreter to update family on the CT results.  Consider admission given patient came in with abdominal pain with coronary disease noted on CT imaging but patient had 2 troponins that were negative on repeat exam his abdomen is soft and nontender and he reports feeling better.  I suspect this is more likely related to his chronic pain.  Given cardiology referral for the coronary disease and we discussed return precautions in regards to this.  He also had some incidentally noted gallstones and will have him follow-up with surgery outpatient but given his age I doubt they will want to do cholecystectomy unless they feel that the pain is from this.  But at this time is no evidence of cholecystitis    I discussed with them return precautions such as fevers, worsening pain, chest pain or any other concerns they felt comfortable with this plan and discharged   The patient is on the cardiac monitor to evaluate for evidence of arrhythmia and/or significant heart rate changes.      FINAL CLINICAL IMPRESSION(S) / ED DIAGNOSES   Final diagnoses:  Mixed hyperlipidemia  Generalized abdominal pain  Gastroenteritis  Calculus of gallbladder without cholecystitis without obstruction     Rx / DC Orders   ED Discharge Orders          Ordered    Ambulatory referral to Cardiology        05/09/22 1459    ondansetron (ZOFRAN-ODT) 4 MG disintegrating tablet  Every 8 hours PRN        05/09/22 1516             Note:  This document was prepared using Dragon voice recognition software and may include unintentional dictation errors.   Concha Se, MD 05/09/22 [phone]

## 2022-05-27 ENCOUNTER — Ambulatory Visit: Payer: Medicare Other | Admitting: Surgery

## 2022-05-31 ENCOUNTER — Ambulatory Visit: Payer: Medicare Other | Admitting: Cardiology

## 2022-06-09 NOTE — Progress Notes (Signed)
Pt with possible edema on xray so bnp ordered--stools studies to evaluate for GI pathogen given multiple stools and elderly age

## 2022-06-19 NOTE — Progress Notes (Signed)
2:32 PM   Bradley Wolf 08-Feb-1929 [id number]  Referring provider: Lauro Regulus, MD [address] [facility] [place],  [place] [address]  Urological history: 1. BPH with LU TS -I PSS 9/2 -finasteride 5 mg daily, tamsulosin 0.4 mg daily and tadalafil 5 mg daily  Chief Complaint  Patient presents with   Benign Prostatic Hypertrophy     HPI: Bradley Wolf is a 88 y.o. male who presents today for yearly follow up with his son, Bradley Wolf.    He was seen in the ED in August for abdominal pain and vomiting. Contrast CT (2023) Remarkable GU findings for prostatomegaly.  He has no urinary complaints at this visit.  Patient denies any modifying or aggravating factors.  Patient denies any gross hematuria, dysuria or suprapubic/flank pain.  Patient denies any fevers, chills, nausea or vomiting.      IPSS     Row Name 06/20/22 1400         International Prostate Symptom Score   How often have you had the sensation of not emptying your bladder? Not at All     How often have you had to urinate less than every two hours? Less than 1 in 5 times     How often have you found you stopped and started again several times when you urinated? Not at All     How often have you found it difficult to postpone urination? Not at All     How often have you had a weak urinary stream? Almost always     How often have you had to strain to start urination? Not at All     How many times did you typically get up at night to urinate? 3 Times     Total IPSS Score 9       Quality of Life due to urinary symptoms   If you were to spend the rest of your life with your urinary condition just the way it is now how would you feel about that? Mostly Satisfied                Score:  1-7 Mild 8-19 Moderate 20-35 Severe   PMH: Past Medical History:  Diagnosis Date   BPH (benign prostatic hyperplasia)    CKD (chronic kidney disease) stage 3, GFR 30-59 ml/min (HCC)  03/19/2014   Depression    Elevated PSA    Generalized weakness    Left groin pain    Left hip pain     Surgical History: Past Surgical History:  Procedure Laterality Date   cirrhosis     ESOPHAGOGASTRODUODENOSCOPY (EGD) WITH PROPOFOL N/A 01/19/2020   Procedure: ESOPHAGOGASTRODUODENOSCOPY (EGD) WITH PROPOFOL;  Surgeon: Toledo, Boykin Nearing, MD;  Location: [facility] ENDOSCOPY;  Service: Gastroenterology;  Laterality: N/A;   LEG SURGERY  2004   due to accident   this is a knee surgery    Home Medications:  Allergies as of 06/20/2022   No Known Allergies      Medication List        Accurate as of June 20, 2022  2:32 PM. If you have any questions, ask your nurse or doctor.          acetaminophen 325 MG tablet Commonly known as: TYLENOL Take by mouth. Reported on 04/25/2016   amitriptyline 10 MG tablet Commonly known as: ELAVIL Take 10 mg by mouth at bedtime.   famotidine 20 MG tablet Commonly known as:  Pepcid Take 1 tablet (20 mg total) by mouth 2 (two) times daily.   finasteride 5 MG tablet Commonly known as: Proscar Take 1 tablet (5 mg total) by mouth daily.   Flovent Diskus 100 MCG/ACT Aepb Generic drug: Fluticasone Propionate (Inhal) Take 1 puff by mouth 2 (two) times daily.   fluticasone 50 MCG/ACT nasal spray Commonly known as: FLONASE Place into the nose.   gabapentin 100 MG capsule Commonly known as: NEURONTIN Take 1 capsule by mouth 3 (three) times daily.   lidocaine 2 % solution Commonly known as: XYLOCAINE Use as directed 15 mLs in the mouth or throat every 4 (four) hours as needed (abd pain).   Opcon-A 0.027-0.315 % Soln Generic drug: Naphazoline-Pheniramine Apply to eye.   pantoprazole 40 MG tablet Commonly known as: PROTONIX Take by mouth.   predniSONE 20 MG tablet Commonly known as: DELTASONE Take 20 mg by mouth 2 (two) times daily.   risperiDONE 0.25 MG tablet Commonly known as: RISPERDAL Take 0.25 mg by mouth at bedtime.   senna  8.6 MG Tabs tablet Commonly known as: SENOKOT Take 1 tablet by mouth.   tadalafil 5 MG tablet Commonly known as: CIALIS TAKE ONE TABLET BY MOUTH DAILY   tamsulosin 0.4 MG Caps capsule Commonly known as: FLOMAX Take 1 capsule by mouth once daily   Up4 Probiotics Ultra Caps Take 1 tablet by mouth 2 (two) times daily.   Vitamin B 12 500 MCG Tabs Take by mouth.        Allergies: No Known Allergies  Family History: Family History  Problem Relation Age of Onset   Benign prostatic hyperplasia Brother    Prostate cancer Neg Hx    Kidney disease Neg Hx    Kidney cancer Neg Hx    Bladder Cancer Neg Hx     Social History:  reports that he quit smoking about 58 years ago. His smoking use included cigarettes. He has never used smokeless tobacco. He reports that he does not drink alcohol and does not use drugs.  ROS: For pertinent review of systems please refer to history of present illness  Physical Exam: BP 135/66   Pulse (!) 108   Ht 5\' 2"  (1.575 m)   Wt 149 lb (67.6 kg)   BMI 27.25 kg/m   Constitutional:  Well nourished. Alert and oriented, No acute distress. HEENT: [place] AT, moist mucus membranes.  Trachea midline Cardiovascular: No clubbing, cyanosis, or edema. Respiratory: Normal respiratory effort, no increased work of breathing. Neurologic: Grossly intact, no focal deficits, moving all 4 extremities. Psychiatric: Normal mood and affect.   Laboratory Data: Component     Latest Ref Rng 05/09/2022  Color, Urine     YELLOW  YELLOW !   Appearance     CLEAR  CLEAR !   Specific Gravity, Urine     1.005 - 1.030  1.009   pH     5.0 - 8.0  7.0   Glucose, UA     NEGATIVE mg/dL NEGATIVE   Hgb urine dipstick     NEGATIVE  NEGATIVE   Bilirubin Urine     NEGATIVE  NEGATIVE   Ketones, ur     NEGATIVE mg/dL NEGATIVE   Protein     NEGATIVE mg/dL NEGATIVE   Nitrite     NEGATIVE  NEGATIVE   Leukocytes,UA     NEGATIVE    RBC / HPF     0 - 5 RBC/hpf   WBC, UA     0 -  5 WBC/hpf   Bacteria, UA     NONE SEEN    Squamous Epithelial / LPF     0 - 5    Mucus   Leukocytes,Ua     NEGATIVE  NEGATIVE     Legend: ! Abnormal     Latest Ref Rng & Units 05/09/2022   10:44 AM 11/21/2020    8:54 AM 12/19/2019    8:16 AM  CMP  Glucose 70 - 99 mg/dL 111  143  130   BUN 8 - 23 mg/dL 21  20  21    Creatinine 0.61 - 1.24 mg/dL 1.23  1.16  1.16   Sodium 135 - 145 mmol/L 137  136  134   Potassium 3.5 - 5.1 mmol/L 4.2  3.7  3.8   Chloride 98 - 111 mmol/L 107  103  97   CO2 22 - 32 mmol/L 21  22  24    Calcium 8.9 - 10.3 mg/dL 9.3  9.5  9.8   Total Protein 6.5 - 8.1 g/dL 7.8  8.0  8.2   Total Bilirubin 0.3 - 1.2 mg/dL 0.7  0.6  0.9   Alkaline Phos 38 - 126 U/L 60  76  70   AST 15 - 41 U/L 22  26  22    ALT 0 - 44 U/L 20  27  31         Latest Ref Rng & Units 05/09/2022   10:44 AM 11/21/2020    8:54 AM 12/19/2019    8:16 AM  CBC  WBC 4.0 - 10.5 K/uL 10.1  6.4  12.9   Hemoglobin 13.0 - 17.0 g/dL 13.6  14.3  14.6   Hematocrit 39.0 - 52.0 % 40.0  40.5  41.8   Platelets 150 - 400 K/uL 214  202  215     Hemoglobin A1C 4.2 - 5.6 % 6.0 High    Average Blood Glucose (Calc) mg/dL 126   Resulting [place] - LAB  Narrative Performed by [place] - LAB Normal Range:    4.2 - 5.6%  Increased Risk:  5.7 - 6.4%  Diabetes:        >= 6.5%  Glycemic Control for adults with diabetes:  <7%    Specimen Collected: 11/14/21 10:42   Performed by: [place]: 11/14/21 11:43  Received From: [place]  Result Received: 05/02/22 13:16  I have reviewed the labs.   Pertinent imaging CLINICAL DATA:  Abdominal pain, vomiting   EXAM: CT ABDOMEN AND PELVIS WITH CONTRAST   TECHNIQUE: Multidetector CT imaging of the abdomen and pelvis was performed using the standard protocol following bolus administration of intravenous contrast.   RADIATION DOSE REDUCTION: This exam was performed according to  the departmental dose-optimization program which includes automated exposure control, adjustment of the mA and/or kV according to patient size and/or use of iterative reconstruction technique.   CONTRAST:  119mL OMNIPAQUE IOHEXOL 300 MG/ML  SOLN   COMPARISON:  11/21/2020   FINDINGS: Lower chest: Three-vessel coronary artery calcifications. Small hiatal hernia. Mild, bandlike scarring and or partial atelectasis of the bilateral lung bases.   Hepatobiliary: No solid liver abnormality is seen. No gallstones, gallbladder wall thickening, or biliary dilatation.   Pancreas: Unremarkable. No pancreatic ductal dilatation or surrounding inflammatory changes.   Spleen: Normal in size without significant abnormality.   Adrenals/Urinary Tract: Adrenal glands are unremarkable. Kidneys are normal, without renal calculi, solid lesion, or hydronephrosis. Bladder  is unremarkable.   Stomach/Bowel: Stomach is within normal limits. Appendix appears normal. No evidence of bowel wall thickening, distention, or inflammatory changes. Gas and fluid-filled, although nondistended loops of small bowel in the central abdomen (series 2, image 43)   Vascular/Lymphatic: Aortic atherosclerosis. No enlarged abdominal or pelvic lymph nodes.   Reproductive: Prostatomegaly.   Other: Small, fat containing right inguinal hernia.  No ascites.   Musculoskeletal: No acute or significant osseous findings.   IMPRESSION: 1. No acute CT findings of the abdomen or pelvis to explain abdominal pain or vomiting. 2. Small hiatal hernia. 3. Prostatomegaly. 4. Coronary artery disease.   Aortic Atherosclerosis (ICD10-I70.0).     Electronically Signed   By: Delanna Ahmadi M.D.   On: 05/09/2022 14:35  I have independently reviewed the films.  See HPI.     Assessment & Plan:    1. BPH with LUTS -aged out of screening -at goal w/ medications  -continue tamsulosin 0.4 mg daily, tadalafil 5 mg and finasteride 5 mg  daily    Return in about 1 year (around 06/21/2023) for I PSS .  [place], [place] [address] [place], [place] [address] [phone]

## 2022-06-20 ENCOUNTER — Ambulatory Visit (INDEPENDENT_AMBULATORY_CARE_PROVIDER_SITE_OTHER): Payer: Medicare Other | Admitting: Urology

## 2022-06-20 ENCOUNTER — Encounter: Payer: Self-pay | Admitting: Urology

## 2022-06-20 VITALS — BP 135/66 | HR 108 | Ht 62.0 in | Wt 149.0 lb

## 2022-06-20 DIAGNOSIS — N401 Enlarged prostate with lower urinary tract symptoms: Secondary | ICD-10-CM

## 2022-06-20 DIAGNOSIS — N138 Other obstructive and reflux uropathy: Secondary | ICD-10-CM

## 2022-06-20 MED ORDER — FINASTERIDE 5 MG PO TABS: 5.0000 mg | ORAL_TABLET | Freq: Every day | ORAL | 3 refills | Status: DC

## 2022-06-25 ENCOUNTER — Emergency Department: Payer: Medicare Other

## 2022-06-25 ENCOUNTER — Inpatient Hospital Stay
Admission: EM | Admit: 2022-06-25 | Discharge: 2022-06-28 | DRG: 418 | Disposition: A | Discharge status: Home / Self Care | Payer: Medicare Other | Attending: Surgery | Admitting: Surgery

## 2022-06-25 ENCOUNTER — Other Ambulatory Visit: Payer: Self-pay

## 2022-06-25 DIAGNOSIS — R1011 Right upper quadrant pain: Principal | ICD-10-CM

## 2022-06-25 DIAGNOSIS — E782 Mixed hyperlipidemia: Secondary | ICD-10-CM | POA: Diagnosis present

## 2022-06-25 DIAGNOSIS — E872 Acidosis, unspecified: Secondary | ICD-10-CM | POA: Diagnosis present

## 2022-06-25 DIAGNOSIS — K219 Gastro-esophageal reflux disease without esophagitis: Secondary | ICD-10-CM | POA: Diagnosis present

## 2022-06-25 DIAGNOSIS — K7460 Unspecified cirrhosis of liver: Secondary | ICD-10-CM | POA: Diagnosis present

## 2022-06-25 DIAGNOSIS — N401 Enlarged prostate with lower urinary tract symptoms: Secondary | ICD-10-CM | POA: Diagnosis present

## 2022-06-25 DIAGNOSIS — R000 Tachycardia, unspecified: Secondary | ICD-10-CM | POA: Diagnosis present

## 2022-06-25 DIAGNOSIS — I340 Nonrheumatic mitral (valve) insufficiency: Secondary | ICD-10-CM | POA: Diagnosis present

## 2022-06-25 DIAGNOSIS — Z87891 Personal history of nicotine dependence: Secondary | ICD-10-CM

## 2022-06-25 DIAGNOSIS — K82A1 Gangrene of gallbladder in cholecystitis: Secondary | ICD-10-CM | POA: Diagnosis present

## 2022-06-25 DIAGNOSIS — K8000 Calculus of gallbladder with acute cholecystitis without obstruction: Principal | ICD-10-CM | POA: Diagnosis present

## 2022-06-25 DIAGNOSIS — N183 Chronic kidney disease, stage 3 unspecified: Secondary | ICD-10-CM | POA: Diagnosis present

## 2022-06-25 DIAGNOSIS — K810 Acute cholecystitis: Secondary | ICD-10-CM | POA: Diagnosis not present

## 2022-06-25 DIAGNOSIS — I129 Hypertensive chronic kidney disease with stage 1 through stage 4 chronic kidney disease, or unspecified chronic kidney disease: Secondary | ICD-10-CM | POA: Diagnosis present

## 2022-06-25 DIAGNOSIS — I4510 Unspecified right bundle-branch block: Secondary | ICD-10-CM | POA: Diagnosis present

## 2022-06-25 DIAGNOSIS — Z20822 Contact with and (suspected) exposure to covid-19: Secondary | ICD-10-CM | POA: Diagnosis present

## 2022-06-25 LAB — URINALYSIS, ROUTINE W REFLEX MICROSCOPIC
Bilirubin Urine: NEGATIVE
Glucose, UA: 50 mg/dL — AB
Ketones, ur: NEGATIVE mg/dL
Leukocytes,Ua: NEGATIVE
Nitrite: NEGATIVE
Protein, ur: NEGATIVE mg/dL
Specific Gravity, Urine: 1.019 (ref 1.005–1.030)
pH: 5 (ref 5.0–8.0)

## 2022-06-25 LAB — COMPREHENSIVE METABOLIC PANEL
ALT: 35 U/L (ref 0–44)
AST: 36 U/L (ref 15–41)
Albumin: 3.6 g/dL (ref 3.5–5.0)
Alkaline Phosphatase: 71 U/L (ref 38–126)
Anion gap: 10 (ref 5–15)
BUN: 25 mg/dL — ABNORMAL HIGH (ref 8–23)
CO2: 24 mmol/L (ref 22–32)
Calcium: 8.6 mg/dL — ABNORMAL LOW (ref 8.9–10.3)
Chloride: 98 mmol/L (ref 98–111)
Creatinine, Ser: 1.24 mg/dL (ref 0.61–1.24)
GFR, Estimated: 55 mL/min — ABNORMAL LOW (ref >60)
Glucose, Bld: 181 mg/dL — ABNORMAL HIGH (ref 70–99)
Potassium: 4.3 mmol/L (ref 3.5–5.1)
Sodium: 132 mmol/L — ABNORMAL LOW (ref 135–145)
Total Bilirubin: 0.7 mg/dL (ref 0.3–1.2)
Total Protein: 7 g/dL (ref 6.5–8.1)

## 2022-06-25 LAB — TROPONIN I (HIGH SENSITIVITY): Troponin I (High Sensitivity): 12 ng/L (ref <18)

## 2022-06-25 LAB — RESP PANEL BY RT-PCR (FLU A&B, COVID) ARPGX2
Influenza A by PCR: NEGATIVE
Influenza B by PCR: NEGATIVE
SARS Coronavirus 2 by RT PCR: NEGATIVE

## 2022-06-25 LAB — LACTIC ACID, PLASMA: Lactic Acid, Venous: 1.5 mmol/L (ref 0.5–1.9)

## 2022-06-25 LAB — CBC
HCT: 43 % (ref 39.0–52.0)
Hemoglobin: 14.2 g/dL (ref 13.0–17.0)
MCH: 28.6 pg (ref 26.0–34.0)
MCHC: 33 g/dL (ref 30.0–36.0)
MCV: 86.7 fL (ref 80.0–100.0)
Platelets: 142 10*3/uL — ABNORMAL LOW (ref 150–400)
RBC: 4.96 MIL/uL (ref 4.22–5.81)
RDW: 13.5 % (ref 11.5–15.5)
WBC: 16.3 10*3/uL — ABNORMAL HIGH (ref 4.0–10.5)
nRBC: 0 % (ref 0.0–0.2)

## 2022-06-25 LAB — LIPASE, BLOOD: Lipase: 40 U/L (ref 11–51)

## 2022-06-25 MED ORDER — ONDANSETRON HCL 4 MG/2ML IJ SOLN: 4.0000 mg | Freq: Four times a day (QID) | INTRAMUSCULAR | Status: DC | PRN

## 2022-06-25 MED ORDER — SODIUM CHLORIDE 0.9 % IV SOLN
2.0000 g | INTRAVENOUS | Status: DC
  Administered 2022-06-25: 2 g via INTRAVENOUS
  Filled 2022-06-25: qty 20

## 2022-06-25 MED ORDER — FLUTICASONE PROPIONATE (INHAL) 100 MCG/ACT IN AEPB: 1.0000 | INHALATION_SPRAY | Freq: Two times a day (BID) | RESPIRATORY_TRACT | Status: DC

## 2022-06-25 MED ORDER — FAMOTIDINE 20 MG PO TABS
20.0000 mg | ORAL_TABLET | Freq: Once | ORAL | Status: AC
  Administered 2022-06-25: 20 mg via ORAL
  Filled 2022-06-25: qty 1

## 2022-06-25 MED ORDER — RISPERIDONE 0.25 MG PO TABS: 0.2500 mg | ORAL_TABLET | Freq: Every day | ORAL | Status: DC

## 2022-06-25 MED ORDER — ONDANSETRON 4 MG PO TBDP: 4.0000 mg | ORAL_TABLET | Freq: Four times a day (QID) | ORAL | Status: DC | PRN

## 2022-06-25 MED ORDER — LACTATED RINGERS IV SOLN: INTRAVENOUS | Status: DC

## 2022-06-25 MED ORDER — FLUTICASONE PROPIONATE 50 MCG/ACT NA SUSP: 2.0000 | Freq: Every day | NASAL | Status: DC | PRN

## 2022-06-25 MED ORDER — GABAPENTIN 100 MG PO CAPS
100.0000 mg | ORAL_CAPSULE | Freq: Three times a day (TID) | ORAL | Status: DC
  Administered 2022-06-25 – 2022-06-26 (×2): 100 mg via ORAL
  Filled 2022-06-25 (×2): qty 1

## 2022-06-25 MED ORDER — ONDANSETRON HCL 4 MG/2ML IJ SOLN
4.0000 mg | Freq: Once | INTRAMUSCULAR | Status: AC
  Administered 2022-06-25: 4 mg via INTRAVENOUS
  Filled 2022-06-25: qty 2

## 2022-06-25 MED ORDER — MORPHINE SULFATE (PF) 2 MG/ML IV SOLN
2.0000 mg | Freq: Once | INTRAVENOUS | Status: AC
  Administered 2022-06-25: 2 mg via INTRAVENOUS
  Filled 2022-06-25: qty 1

## 2022-06-25 MED ORDER — TAMSULOSIN HCL 0.4 MG PO CAPS
0.4000 mg | ORAL_CAPSULE | Freq: Every day | ORAL | Status: DC
  Administered 2022-06-26: 0.4 mg via ORAL
  Filled 2022-06-25: qty 1

## 2022-06-25 MED ORDER — SODIUM CHLORIDE 0.9 % IV BOLUS
500.0000 mL | Freq: Once | INTRAVENOUS | Status: AC
  Administered 2022-06-25: 500 mL via INTRAVENOUS

## 2022-06-25 MED ORDER — IOHEXOL 300 MG/ML  SOLN
100.0000 mL | Freq: Once | INTRAMUSCULAR | Status: AC | PRN
  Administered 2022-06-25: 100 mL via INTRAVENOUS

## 2022-06-25 MED ORDER — GADOPICLENOL 0.5 MMOL/ML IV SOLN
7.0000 mL | Freq: Once | INTRAVENOUS | Status: AC | PRN
  Administered 2022-06-25: 7 mL via INTRAVENOUS

## 2022-06-25 MED ORDER — PANTOPRAZOLE SODIUM 40 MG IV SOLR
40.0000 mg | Freq: Every day | INTRAVENOUS | Status: DC
  Administered 2022-06-25: 40 mg via INTRAVENOUS
  Filled 2022-06-25: qty 10

## 2022-06-25 MED ORDER — FINASTERIDE 5 MG PO TABS
5.0000 mg | ORAL_TABLET | Freq: Every day | ORAL | Status: DC
  Administered 2022-06-26: 5 mg via ORAL
  Filled 2022-06-25: qty 1

## 2022-06-25 MED ORDER — AMITRIPTYLINE HCL 10 MG PO TABS
10.0000 mg | ORAL_TABLET | Freq: Every day | ORAL | Status: DC
  Filled 2022-06-25: qty 1

## 2022-06-25 MED ORDER — PREDNISONE 20 MG PO TABS
20.0000 mg | ORAL_TABLET | Freq: Two times a day (BID) | ORAL | Status: DC
  Administered 2022-06-25 – 2022-06-26 (×2): 20 mg via ORAL
  Filled 2022-06-25 (×2): qty 1

## 2022-06-25 MED ORDER — HYDROMORPHONE HCL 1 MG/ML IJ SOLN: 0.5000 mg | INTRAMUSCULAR | Status: DC | PRN

## 2022-06-25 MED ORDER — LIDOCAINE VISCOUS HCL 2 % MT SOLN
15.0000 mL | Freq: Once | OROMUCOSAL | Status: AC
  Administered 2022-06-25: 15 mL via OROMUCOSAL
  Filled 2022-06-25: qty 15

## 2022-06-25 NOTE — ED Notes (Signed)
Pt ambulated to BR with steady gait. Standby assist provided by family member at bedside.

## 2022-06-25 NOTE — ED Notes (Signed)
Called lab to inquire about lactic acid sent at 1440. Per lab, never received. Will recollect.

## 2022-06-25 NOTE — ED Notes (Signed)
Pt remains gone to [place] at this time.

## 2022-06-25 NOTE — ED Notes (Signed)
MD and interpreter at bedside assessing pt. Son remains at bedside.

## 2022-06-25 NOTE — ED Notes (Addendum)
Pt transported to US via stretcher at this time.  

## 2022-06-25 NOTE — ED Notes (Signed)
Pt transported back to room via stretcher from MRI.

## 2022-06-25 NOTE — ED Notes (Signed)
Pt transported back to room from [place].

## 2022-06-25 NOTE — ED Triage Notes (Signed)
Pt here with abd pain since last night. Pt states pain is in  upper abd region. Pt denies vomiting and nausea.

## 2022-06-25 NOTE — H&P (Signed)
Patient ID: Bradley Wolf, male   DOB: 05-Aug-1929, 88 y.o.   MRN: [id number]  Chief Complaint:  Right sided abdominal pain.    History of Present Illness Bradley Wolf is a 88 y.o. male with acute onset of abdominal pain last night, similar to prior episodes, but this significantly worse. Denies F/C, N/V or bowel habit changes.  Past Medical History Past Medical History:  Diagnosis Date   BPH (benign prostatic hyperplasia)    CKD (chronic kidney disease) stage 3, GFR 30-59 ml/min (HCC) 03/19/2014   Depression    Elevated PSA    Generalized weakness    Left groin pain    Left hip pain       Past Surgical History:  Procedure Laterality Date   cirrhosis     ESOPHAGOGASTRODUODENOSCOPY (EGD) WITH PROPOFOL N/A 01/19/2020   Procedure: ESOPHAGOGASTRODUODENOSCOPY (EGD) WITH PROPOFOL;  Surgeon: Toledo, Benay Pike, MD;  Location: [facility] ENDOSCOPY;  Service: Gastroenterology;  Laterality: N/A;   LEG SURGERY  2004   due to accident   this is a knee surgery    No Known Allergies  No current facility-administered medications for this encounter.   Current Outpatient Medications  Medication Sig Dispense Refill   acetaminophen (TYLENOL) 325 MG tablet Take by mouth. Reported on 04/25/2016 (Patient not taking: Reported on 06/20/2022)     amitriptyline (ELAVIL) 10 MG tablet Take 10 mg by mouth at bedtime.     Cyanocobalamin (VITAMIN B 12) 500 MCG TABS Take by mouth. (Patient not taking: Reported on 06/20/2022)     famotidine (PEPCID) 20 MG tablet Take 1 tablet (20 mg total) by mouth 2 (two) times daily. (Patient not taking: Reported on 06/20/2022) 60 tablet 1   finasteride (PROSCAR) 5 MG tablet Take 1 tablet (5 mg total) by mouth daily. 90 tablet 3   FLOVENT DISKUS 100 MCG/ACT AEPB Take 1 puff by mouth 2 (two) times daily.     fluticasone (FLONASE) 50 MCG/ACT nasal spray Place into the nose. (Patient not taking: Reported on 06/20/2022)     gabapentin (NEURONTIN) 100 MG capsule Take 1 capsule by  mouth 3 (three) times daily. (Patient not taking: Reported on 06/25/2022)     lidocaine (XYLOCAINE) 2 % solution Use as directed 15 mLs in the mouth or throat every 4 (four) hours as needed (abd pain). (Patient not taking: Reported on 06/20/2022) 100 mL 0   Naphazoline-Pheniramine (OPCON-A) 0.027-0.315 % SOLN Apply to eye. (Patient not taking: Reported on 06/20/2022)     pantoprazole (PROTONIX) 40 MG tablet Take by mouth.     predniSONE (DELTASONE) 20 MG tablet Take 20 mg by mouth 2 (two) times daily.     Probiotic Product (UP4 PROBIOTICS ULTRA) CAPS Take 1 tablet by mouth 2 (two) times daily. (Patient not taking: Reported on 06/20/2022) 30 capsule 1   risperiDONE (RISPERDAL) 0.25 MG tablet Take 0.25 mg by mouth at bedtime.     senna (SENOKOT) 8.6 MG TABS tablet Take 1 tablet by mouth. (Patient not taking: Reported on 06/20/2022)     sucralfate (CARAFATE) 1 g tablet Take 1 g by mouth 4 (four) times daily.     tadalafil (CIALIS) 5 MG tablet TAKE ONE TABLET BY MOUTH DAILY 30 tablet 6   tamsulosin (FLOMAX) 0.4 MG CAPS capsule Take 1 capsule by mouth once daily 90 capsule 0    Family History Family History  Problem Relation Age of Onset   Benign prostatic hyperplasia Brother    Prostate cancer Neg Hx  Kidney disease Neg Hx    Kidney cancer Neg Hx    Bladder Cancer Neg Hx       Social History Social History   Tobacco Use   Smoking status: Former    Types: Cigarettes    Quit date: 1965    Years since quitting: 58.7   Smokeless tobacco: Never  Vaping Use   Vaping Use: Never used  Substance Use Topics   Alcohol use: No    Alcohol/week: 0.0 standard drinks of alcohol   Drug use: No       Physical Exam Blood pressure (!) 124/47, pulse (!) 102, temperature 98.3 F (36.8 C), temperature source Oral, resp. rate (!) 44, height 5\' 2"  (1.575 m), weight 67.6 kg, SpO2 97 %. Last Weight  Most recent update: 06/25/2022  9:57 AM    Weight  67.6 kg (149 lb 0.5 oz)              CONSTITUTIONAL: Well developed, and nourished, appropriately responsive and aware without distress.  EYES: Sclera non-icteric.   EARS, NOSE, MOUTH AND THROAT:  The oropharynx is clear. Oral mucosa is pink and moist.   Hearing is intact to voice.  NECK: Trachea is midline, and there is no jugular venous distension.  LYMPH NODES:  Lymph nodes in the neck are not enlarged. RESPIRATORY:  Normal respiratory effort without pathologic use of accessory muscles. CARDIOVASCULAR: Heart is regular in rate and rhythm. GI: The abdomen is tender in the RUQ, o/w soft, nontender, and nondistended. There were no palpable masses. I did not appreciate hepatosplenomegaly. There were normal bowel sounds. MUSCULOSKELETAL:  Symmetrical muscle tone appreciated in all four extremities.    SKIN: Skin turgor is normal. No pathologic skin lesions appreciated.  NEUROLOGIC:  Motor and sensation appear grossly normal.  Cranial nerves are grossly without defect. PSYCH:  Alert and oriented to person, place and time. Affect is appropriate for situation.  Data Reviewed I have personally reviewed what is currently available of the patient's imaging, recent labs and medical records.   Labs:     Latest Ref Rng & Units 06/25/2022   10:22 AM 05/09/2022   10:44 AM 11/21/2020    8:54 AM  CBC  WBC 4.0 - 10.5 K/uL 16.3  10.1  6.4   Hemoglobin 13.0 - 17.0 g/dL 14.2  13.6  14.3   Hematocrit 39.0 - 52.0 % 43.0  40.0  40.5   Platelets 150 - 400 K/uL 142  214  202       Latest Ref Rng & Units 06/25/2022   10:22 AM 05/09/2022   10:44 AM 11/21/2020    8:54 AM  CMP  Glucose 70 - 99 mg/dL 181  111  143   BUN 8 - 23 mg/dL 25  21  20    Creatinine 0.61 - 1.24 mg/dL 1.24  1.23  1.16   Sodium 135 - 145 mmol/L 132  137  136   Potassium 3.5 - 5.1 mmol/L 4.3  4.2  3.7   Chloride 98 - 111 mmol/L 98  107  103   CO2 22 - 32 mmol/L 24  21  22    Calcium 8.9 - 10.3 mg/dL 8.6  9.3  9.5   Total Protein 6.5 - 8.1 g/dL 7.0  7.8  8.0   Total  Bilirubin 0.3 - 1.2 mg/dL 0.7  0.7  0.6   Alkaline Phos 38 - 126 U/L 71  60  76   AST 15 - 41 U/L 36  22  26   ALT 0 - 44 U/L 35  20  27       Imaging:  Within last 24 hrs: MR ABDOMEN MRCP W WO CONTAST  Result Date: 06/25/2022 CLINICAL DATA:  Upper abdominal pain. EXAM: MRI ABDOMEN WITHOUT AND WITH CONTRAST (INCLUDING MRCP) TECHNIQUE: Multiplanar multisequence MR imaging of the abdomen was performed both before and after the administration of intravenous contrast. Heavily T2-weighted images of the biliary and pancreatic ducts were obtained, and three-dimensional MRCP images were rendered by post processing. CONTRAST:  7 cc Vueway COMPARISON:  CT scan and ultrasound examination, same date. FINDINGS: Lower chest: The lung bases are clear of an acute process. No pleural or pericardial effusion. Hepatobiliary: No hepatic lesions or intrahepatic biliary dilatation. Mild gallbladder distension. Numerous layering gallstones are noted in the dependent portion of the gallbladder. No gallbladder wall thickening, abnormal gallbladder wall enhancement, pericholecystic fluid or pericholecystic inflammatory changes. Normal caliber and course of the common bile duct. No common bile duct stones are identified. Pancreas:  No mass, inflammation or ductal dilatation. Spleen:  Normal size.  No focal lesions. Adrenals/Urinary Tract: The adrenal glands and kidneys are unremarkable. Stomach/Bowel: The stomach demonstrates a small hiatal hernia. The duodenum, visualized small bowel and visualized colon are unremarkable. Vascular/Lymphatic: The aorta and branch vessels are patent. Moderate atherosclerotic changes. No dissection. The major venous structures are patent. No mesenteric or retroperitoneal mass or adenopathy. Other:  No ascites or abdominal wall hernia. Musculoskeletal: No significant bony findings. IMPRESSION: 1. Cholelithiasis and mild gallbladder distension but no MR findings to suggest acute cholecystitis. 2.  Normal caliber and course of the common bile duct. No common bile duct stones. 3. No abdominal mass or adenopathy. 4. Small hiatal hernia. Electronically Signed   By: Marijo Sanes M.D.   On: 06/25/2022 16:42   MR 3D Recon At Scanner  Result Date: 06/25/2022 CLINICAL DATA:  Upper abdominal pain. EXAM: MRI ABDOMEN WITHOUT AND WITH CONTRAST (INCLUDING MRCP) TECHNIQUE: Multiplanar multisequence MR imaging of the abdomen was performed both before and after the administration of intravenous contrast. Heavily T2-weighted images of the biliary and pancreatic ducts were obtained, and three-dimensional MRCP images were rendered by post processing. CONTRAST:  7 cc Vueway COMPARISON:  CT scan and ultrasound examination, same date. FINDINGS: Lower chest: The lung bases are clear of an acute process. No pleural or pericardial effusion. Hepatobiliary: No hepatic lesions or intrahepatic biliary dilatation. Mild gallbladder distension. Numerous layering gallstones are noted in the dependent portion of the gallbladder. No gallbladder wall thickening, abnormal gallbladder wall enhancement, pericholecystic fluid or pericholecystic inflammatory changes. Normal caliber and course of the common bile duct. No common bile duct stones are identified. Pancreas:  No mass, inflammation or ductal dilatation. Spleen:  Normal size.  No focal lesions. Adrenals/Urinary Tract: The adrenal glands and kidneys are unremarkable. Stomach/Bowel: The stomach demonstrates a small hiatal hernia. The duodenum, visualized small bowel and visualized colon are unremarkable. Vascular/Lymphatic: The aorta and branch vessels are patent. Moderate atherosclerotic changes. No dissection. The major venous structures are patent. No mesenteric or retroperitoneal mass or adenopathy. Other:  No ascites or abdominal wall hernia. Musculoskeletal: No significant bony findings. IMPRESSION: 1. Cholelithiasis and mild gallbladder distension but no MR findings to suggest  acute cholecystitis. 2. Normal caliber and course of the common bile duct. No common bile duct stones. 3. No abdominal mass or adenopathy. 4. Small hiatal hernia. Electronically Signed   By: Marijo Sanes M.D.   On: 06/25/2022 16:42   CT ABDOMEN  PELVIS W CONTRAST  Result Date: 06/25/2022 CLINICAL DATA:  Acute nonlocalized abdominal pain in a 88 year old male. EXAM: CT ABDOMEN AND PELVIS WITH CONTRAST TECHNIQUE: Multidetector CT imaging of the abdomen and pelvis was performed using the standard protocol following bolus administration of intravenous contrast. RADIATION DOSE REDUCTION: This exam was performed according to the departmental dose-optimization program which includes automated exposure control, adjustment of the mA and/or kV according to patient size and/or use of iterative reconstruction technique. CONTRAST:  159mL OMNIPAQUE IOHEXOL 300 MG/ML  SOLN COMPARISON:  May 09, 2022 FINDINGS: Lower chest: Basilar atelectasis without effusion or sign of consolidative changes. Heart size normal. Incompletely imaged without pericardial effusion. Hepatobiliary: Mild caudate enlargement. Mild hepatic steatosis. No focal, suspicious hepatic lesion. Portal vein is patent. Mild pericholecystic stranding best seen on coronal images. No frank wall thickening. No pericholecystic fluid. Common bile duct dilated up to 8 mm. Pancreas: Normal, without mass, inflammation or ductal dilatation. Spleen: Normal. Adrenals/Urinary Tract: Adrenal glands are normal. Symmetric renal enhancement without signs of hydronephrosis. No perinephric stranding. No perivesical stranding. No suspicious renal lesion. Stomach/Bowel: Small hiatal hernia. Signs of enteritis present on the previous exam in the proximal small bowel are no longer noted. No signs of bowel obstruction. No colonic inflammation. Appendix not visualized but no secondary signs to suggest acute appendicitis. Vascular/Lymphatic: Aortic atherosclerosis. No sign of aneurysm.  Smooth contour of the IVC. There is no gastrohepatic or hepatoduodenal ligament lymphadenopathy. No retroperitoneal or mesenteric lymphadenopathy. No pelvic sidewall lymphadenopathy. Atherosclerotic changes are mild with calcified and noncalcified plaque. Reproductive: Prostatomegaly as before, moderate prostatomegaly, nonspecific finding on CT. Other: Small fat containing RIGHT inguinal hernia. Musculoskeletal: No acute bone finding. No destructive bone process. Spinal degenerative changes. IMPRESSION: 1. Mild pericholecystic stranding best seen on coronal images. No frank wall thickening. No pericholecystic fluid. Findings are nonspecific but can be seen in the setting of early acute cholecystitis and are associated with mildly dilated common bile duct. Correlate with clinical and laboratory values and given top-normal caliber of the common bile duct consider HIDA or MRCP as warranted for further evaluation. 2. Query background liver disease with fissural widening and caudate hypertrophy. Correlate with any risk factors for liver disease in this patient with mild hepatic steatosis. 3. Resolution of enteritis seen on previous imaging. 4. Aortic atherosclerosis Aortic Atherosclerosis (ICD10-I70.0). Electronically Signed   By: Zetta Bills M.D.   On: 06/25/2022 13:06   US Abdomen Limited RUQ (LIVER/GB)  Result Date: 06/25/2022 CLINICAL DATA:  Right upper quadrant pain. EXAM: ULTRASOUND ABDOMEN LIMITED RIGHT UPPER QUADRANT COMPARISON:  CT abdomen/pelvis 05/09/2022. FINDINGS: Gallbladder: No gallbladder wall thickening. Multiple gallstones, measuring up to 1 cm. No sonographic Murphy sign noted by sonographer. Common bile duct: Diameter: 6.7 mm, dilated. Liver: No focal lesion identified. The liver is diffusely hyperechoic relative to the adjacent kidney. Portal vein is patent on color Doppler imaging with normal direction of blood flow towards the liver. IMPRESSION: 1. Cholelithiasis without evidence of  cholecystitis. 2. Common bile duct is dilated, measuring 6.7 mm. Recommend correlation with LFTs and MRCP if there is concern for obstruction. 3. Diffusely hyperechoic liver, nonspecific but most commonly related to hepatic steatosis. Electronically Signed   By: Margaretha Sheffield M.D.   On: 06/25/2022 12:09    Assessment    *** Patient Active Problem List   Diagnosis Date Noted   Moderate episode of recurrent major depressive disorder ([place]) 10/20/2019   BPH with obstruction/lower urinary tract symptoms 08/10/2015   Elevated PSA 08/10/2015  Mixed hyperlipidemia 03/15/2015   Moderate mitral insufficiency 03/15/2015   CKD (chronic kidney disease) stage 3, GFR 30-59 ml/min (HCC) 03/19/2014    Plan    ***  Face-to-face time spent with the patient and accompanying care providers(if present) was *** minutes, with more than 50% of the time spent counseling, educating, and coordinating care of the patient.    These notes generated with voice recognition software. I apologize for typographical errors.  [name] M.D., FACS 06/25/2022, 7:33 PM

## 2022-06-25 NOTE — ED Provider Notes (Signed)
[facility] Provider Note    Event Date/Time   First MD Initiated Contact with Patient 06/25/22 1001     (approximate)   History   Abdominal Pain   HPI  HPI obtained via in-person Spanish interpreter  Kenechukwu Eckstein is a 88 y.o. male with a history of CKD, BPH, and hypertension who presents with abdominal pain, acute onset since last night, mainly in the right mid abdomen, and not associated with nausea, vomiting, or diarrhea.  He has no urinary symptoms.  He denies fever or chills.  He states he has had some abdominal pain previously but nothing like what he is feeling currently.  He denies a previous history of abdominal surgeries.    Physical Exam   Triage Vital Signs: ED Triage Vitals  Enc Vitals Group     BP 06/25/22 0956 (!) 153/62     Pulse Rate 06/25/22 0956 (!) 106     Resp 06/25/22 0956 18     Temp 06/25/22 0956 98.9 F (37.2 C)     Temp Source 06/25/22 0956 Oral     SpO2 06/25/22 0956 97 %     Weight 06/25/22 0957 149 lb 0.5 oz (67.6 kg)     Height 06/25/22 0957 5\' 2"  (1.575 m)     Head Circumference --      Peak Flow --      Pain Score 06/25/22 0957 7     Pain Loc --      Pain Edu? --      Excl. in [place]? --     Most recent vital signs: Vitals:   06/25/22 1430 06/25/22 1500  BP: (!) 145/45 (!) 127/49  Pulse: (!) 113 100  Resp: (!) 26 (!) 35  Temp: 98.5 F (36.9 C)   SpO2: 99% 100%     General: Alert and oriented, well appearing for age. CV:  Good peripheral perfusion.  Resp:  Normal effort.  Abd:  Soft with moderate right mid and upper abdominal tenderness.  No distention.  Other:  No scleral icterus.   ED Results / Procedures / Treatments   Labs (all labs ordered are listed, but only abnormal results are displayed) Labs Reviewed  COMPREHENSIVE METABOLIC PANEL - Abnormal; Notable for the following components:      Result Value   Sodium 132 (*)    Glucose, Bld 181 (*)    BUN 25 (*)    Calcium 8.6 (*)     GFR, Estimated 55 (*)    All other components within normal limits  CBC - Abnormal; Notable for the following components:   WBC 16.3 (*)    Platelets 142 (*)    All other components within normal limits  URINALYSIS, ROUTINE W REFLEX MICROSCOPIC - Abnormal; Notable for the following components:   Color, Urine YELLOW (*)    APPearance CLEAR (*)    Glucose, UA 50 (*)    Hgb urine dipstick SMALL (*)    Bacteria, UA RARE (*)    All other components within normal limits  LIPASE, BLOOD  LACTIC ACID, PLASMA  LACTIC ACID, PLASMA  TROPONIN I (HIGH SENSITIVITY)     EKG  ED ECG REPORT I, Arta Silence, the attending physician, personally viewed and interpreted this ECG.  Date: 06/25/2022 EKG Time: 1017 Rate: 112 Rhythm: Sinus tachycardia QRS Axis: normal Intervals: RBBB, LAFB ST/T Wave abnormalities: normal Narrative Interpretation: no evidence of acute ischemia; no significant change when compared to EKG of 05/09/2022  RADIOLOGY  CT abdomen/pelvis: I independently viewed and interpreted the images; there are no dilated bowel loops or free fluid.  Radiology report indicates possible mild pericholecystic stranding  US abdomen RUQ: I independently viewed and interpreted the images; there are gallstones and a dilated CBD  PROCEDURES:  Critical Care performed: No  Procedures   MEDICATIONS ORDERED IN ED: Medications  gadopiclenol (VUEWAY) 0.5 MMOL/ML solution 7 mL (has no administration in time range)  morphine (PF) 2 MG/ML injection 2 mg (2 mg Intravenous Given 06/25/22 1051)  ondansetron (ZOFRAN) injection 4 mg (4 mg Intravenous Given 06/25/22 1051)  famotidine (PEPCID) tablet 20 mg (20 mg Oral Given 06/25/22 1051)  sodium chloride 0.9 % bolus 500 mL (0 mLs Intravenous Stopped 06/25/22 1251)  iohexol (OMNIPAQUE) 300 MG/ML solution 100 mL (100 mLs Intravenous Contrast Given 06/25/22 1228)  lidocaine (XYLOCAINE) 2 % viscous mouth solution 15 mL (15 mLs Mouth/Throat Given  06/25/22 1434)  sodium chloride 0.9 % bolus 500 mL (500 mLs Intravenous New Bag/Given 06/25/22 1434)     IMPRESSION / MDM / ASSESSMENT AND PLAN / ED COURSE  I reviewed the triage vital signs and the nursing notes.  88 year old male with PMH as noted above presents with right-sided abdominal pain since last night.  I reviewed the past medical records.  The patient was most recently seen in the ED on 8/3 with nausea, vomiting, and diarrhea but had a reassuring ED work-up and was discharged home.  He was seen by [facility]rology 5 days ago for yearly follow-up for BPH.  On exam the patient is well-appearing for age.  He is slightly tachycardic.  Abdomen is soft with some right-sided tenderness.  Differential diagnosis includes, but is not limited to, colitis, diverticulitis, acute appendicitis, biliary colic, cholecystitis, choledocholithiasis, pancreatitis.  Patient's presentation is most consistent with acute presentation with potential threat to life or bodily function.  We will obtain CT abdomen, right upper quadrant ultrasound, lab work-up, and reassess.  The patient is on the cardiac monitor to evaluate for evidence of arrhythmia and/or significant heart rate changes.  ----------------------------------------- 4:00 PM on 06/25/2022 -----------------------------------------  Lab work-up reveals leukocytosis.  LFTs are normal.  However the imaging shows equivocal findings for acute hepatobiliary process with some pericholecystic stranding, gallstones, and a dilated CBD.  Radiology recommends MRCP for further evaluation.  This is pending and will determine whether the patient needs any procedural intervention.  I signed the patient out to the oncoming ED physician Dr. Fanny Bien.  FINAL CLINICAL IMPRESSION(S) / ED DIAGNOSES   Final diagnoses:  Right upper quadrant abdominal pain     Rx / DC Orders   ED Discharge Orders     None        Note:  This document was prepared using  Dragon voice recognition software and may include unintentional dictation errors.    Dionne Bucy, MD 06/25/22 1601

## 2022-06-26 ENCOUNTER — Inpatient Hospital Stay: Payer: Medicare Other | Admitting: Anesthesiology

## 2022-06-26 ENCOUNTER — Encounter: Payer: Self-pay | Admitting: Surgery

## 2022-06-26 ENCOUNTER — Other Ambulatory Visit: Payer: Self-pay

## 2022-06-26 ENCOUNTER — Encounter
Admission: EM | Disposition: A | Discharge status: Home / Self Care | Payer: Self-pay | Source: Home / Self Care | Attending: Surgery

## 2022-06-26 DIAGNOSIS — R1011 Right upper quadrant pain: Principal | ICD-10-CM | POA: Insufficient documentation

## 2022-06-26 DIAGNOSIS — K8000 Calculus of gallbladder with acute cholecystitis without obstruction: Secondary | ICD-10-CM

## 2022-06-26 SURGERY — CHOLECYSTECTOMY, ROBOT-ASSISTED, LAPAROSCOPIC
Anesthesia: General | Site: Abdomen

## 2022-06-26 MED ORDER — PROPOFOL 10 MG/ML IV BOLUS
INTRAVENOUS | Status: DC | PRN
  Administered 2022-06-26: 60 mg via INTRAVENOUS

## 2022-06-26 MED ORDER — SUGAMMADEX SODIUM 500 MG/5ML IV SOLN
INTRAVENOUS | Status: DC | PRN
  Administered 2022-06-26: 300 mg via INTRAVENOUS

## 2022-06-26 MED ORDER — AMITRIPTYLINE HCL 10 MG PO TABS
20.0000 mg | ORAL_TABLET | Freq: Every day | ORAL | Status: DC
  Administered 2022-06-26 – 2022-06-27 (×2): 20 mg via ORAL
  Filled 2022-06-26 (×2): qty 2

## 2022-06-26 MED ORDER — ROCURONIUM BROMIDE 100 MG/10ML IV SOLN
INTRAVENOUS | Status: DC | PRN
  Administered 2022-06-26: 50 mg via INTRAVENOUS
  Administered 2022-06-26: 20 mg via INTRAVENOUS

## 2022-06-26 MED ORDER — FENTANYL CITRATE (PF) 100 MCG/2ML IJ SOLN
25.0000 ug | INTRAMUSCULAR | Status: DC | PRN
  Administered 2022-06-26: 25 ug via INTRAVENOUS

## 2022-06-26 MED ORDER — FENTANYL CITRATE (PF) 100 MCG/2ML IJ SOLN
INTRAMUSCULAR | Status: DC | PRN
  Administered 2022-06-26 (×2): 25 ug via INTRAVENOUS
  Administered 2022-06-26: 50 ug via INTRAVENOUS

## 2022-06-26 MED ORDER — LABETALOL HCL 5 MG/ML IV SOLN
INTRAVENOUS | Status: DC | PRN
  Administered 2022-06-26: 10 mg via INTRAVENOUS

## 2022-06-26 MED ORDER — INDOCYANINE GREEN 25 MG IV SOLR
1.2500 mg | Freq: Once | INTRAVENOUS | Status: DC
  Filled 2022-06-26: qty 10

## 2022-06-26 MED ORDER — LACTATED RINGERS IV SOLN: INTRAVENOUS | Status: DC

## 2022-06-26 MED ORDER — ACETAMINOPHEN 500 MG PO TABS
1000.0000 mg | ORAL_TABLET | Freq: Four times a day (QID) | ORAL | Status: DC | PRN
  Administered 2022-06-26 – 2022-06-27 (×2): 1000 mg via ORAL
  Filled 2022-06-26 (×2): qty 2

## 2022-06-26 MED ORDER — ACETAMINOPHEN 10 MG/ML IV SOLN: 1000.0000 mg | Freq: Once | INTRAVENOUS | Status: DC | PRN

## 2022-06-26 MED ORDER — LACTATED RINGERS IV SOLN: INTRAVENOUS | Status: DC | PRN

## 2022-06-26 MED ORDER — FENTANYL CITRATE (PF) 100 MCG/2ML IJ SOLN
INTRAMUSCULAR | Status: AC
  Filled 2022-06-26: qty 2

## 2022-06-26 MED ORDER — RISPERIDONE 1 MG PO TABS
0.5000 mg | ORAL_TABLET | Freq: Two times a day (BID) | ORAL | Status: DC
  Administered 2022-06-26: 0.5 mg via ORAL
  Filled 2022-06-26: qty 1

## 2022-06-26 MED ORDER — ESMOLOL HCL 100 MG/10ML IV SOLN
INTRAVENOUS | Status: DC | PRN
  Administered 2022-06-26 (×2): 2 mg via INTRAVENOUS
  Administered 2022-06-26: 1 mg via INTRAVENOUS

## 2022-06-26 MED ORDER — ACETAMINOPHEN 10 MG/ML IV SOLN
INTRAVENOUS | Status: DC | PRN
  Administered 2022-06-26: 1000 mg via INTRAVENOUS

## 2022-06-26 MED ORDER — ONDANSETRON HCL 4 MG/2ML IJ SOLN
INTRAMUSCULAR | Status: DC | PRN
  Administered 2022-06-26: 4 mg via INTRAVENOUS

## 2022-06-26 MED ORDER — ONDANSETRON HCL 4 MG/2ML IJ SOLN: 4.0000 mg | Freq: Once | INTRAMUSCULAR | Status: DC | PRN

## 2022-06-26 MED ORDER — BUPIVACAINE-EPINEPHRINE (PF) 0.25% -1:200000 IJ SOLN
INTRAMUSCULAR | Status: DC | PRN
  Administered 2022-06-26 (×2): 15 mL

## 2022-06-26 MED ORDER — BUPIVACAINE-EPINEPHRINE (PF) 0.25% -1:200000 IJ SOLN
INTRAMUSCULAR | Status: AC
  Filled 2022-06-26: qty 30

## 2022-06-26 MED ORDER — 0.9 % SODIUM CHLORIDE (POUR BTL) OPTIME
TOPICAL | Status: DC | PRN
  Administered 2022-06-26: 500 mL

## 2022-06-26 MED ORDER — OXYCODONE HCL 5 MG PO TABS: 5.0000 mg | ORAL_TABLET | Freq: Once | ORAL | Status: DC | PRN

## 2022-06-26 MED ORDER — DEXAMETHASONE SODIUM PHOSPHATE 10 MG/ML IJ SOLN
INTRAMUSCULAR | Status: DC | PRN
  Administered 2022-06-26: 10 mg via INTRAVENOUS

## 2022-06-26 MED ORDER — BUDESONIDE 0.25 MG/2ML IN SUSP
0.2500 mg | Freq: Two times a day (BID) | RESPIRATORY_TRACT | Status: DC
  Administered 2022-06-26 – 2022-06-28 (×5): 0.25 mg via RESPIRATORY_TRACT
  Filled 2022-06-26 (×5): qty 2

## 2022-06-26 MED ORDER — FENTANYL CITRATE (PF) 100 MCG/2ML IJ SOLN
INTRAMUSCULAR | Status: AC
  Administered 2022-06-26: 25 ug via INTRAVENOUS
  Filled 2022-06-26: qty 2

## 2022-06-26 MED ORDER — ACETAMINOPHEN 10 MG/ML IV SOLN
INTRAVENOUS | Status: AC
  Filled 2022-06-26: qty 100

## 2022-06-26 MED ORDER — OXYCODONE HCL 5 MG/5ML PO SOLN: 5.0000 mg | Freq: Once | ORAL | Status: DC | PRN

## 2022-06-26 MED ORDER — LIDOCAINE HCL (CARDIAC) PF 100 MG/5ML IV SOSY
PREFILLED_SYRINGE | INTRAVENOUS | Status: DC | PRN
  Administered 2022-06-26: 80 mg via INTRAVENOUS

## 2022-06-26 MED ORDER — METRONIDAZOLE 500 MG/100ML IV SOLN
500.0000 mg | Freq: Two times a day (BID) | INTRAVENOUS | Status: DC
  Administered 2022-06-26 – 2022-06-28 (×4): 500 mg via INTRAVENOUS
  Filled 2022-06-26 (×4): qty 100

## 2022-06-26 SURGICAL SUPPLY — 49 items
ADH SKN CLS APL DERMABOND .7 (GAUZE/BANDAGES/DRESSINGS) ×2
BAG PRESSURE INF REUSE 3000 (BAG) IMPLANT
BULB RESERV EVAC DRAIN JP 100C (MISCELLANEOUS) IMPLANT
CLIP LIGATING HEM O LOK PURPLE (MISCELLANEOUS) ×2 IMPLANT
COVER TIP SHEARS 8 DVNC (MISCELLANEOUS) ×2 IMPLANT
COVER TIP SHEARS 8MM DA VINCI (MISCELLANEOUS) ×2
DERMABOND ADVANCED .7 DNX12 (GAUZE/BANDAGES/DRESSINGS) ×2 IMPLANT
DRAIN CHANNEL JP 19F (MISCELLANEOUS) IMPLANT
DRAPE ARM DVNC X/XI (DISPOSABLE) ×8 IMPLANT
DRAPE COLUMN DVNC XI (DISPOSABLE) ×2 IMPLANT
DRAPE DA VINCI XI ARM (DISPOSABLE) ×8
DRAPE DA VINCI XI COLUMN (DISPOSABLE) ×2
DRSG TEGADERM 2-3/8X2-3/4 SM (GAUZE/BANDAGES/DRESSINGS) IMPLANT
ELECT CAUTERY BLADE 6.4 (BLADE) ×2 IMPLANT
GLOVE ORTHO TXT STRL SZ7.5 (GLOVE) ×4 IMPLANT
GOWN STRL REUS W/ TWL LRG LVL3 (GOWN DISPOSABLE) ×4 IMPLANT
GOWN STRL REUS W/ TWL XL LVL3 (GOWN DISPOSABLE) ×4 IMPLANT
GOWN STRL REUS W/TWL LRG LVL3 (GOWN DISPOSABLE) ×4
GOWN STRL REUS W/TWL XL LVL3 (GOWN DISPOSABLE) ×4
GRASPER SUT TROCAR 14GX15 (MISCELLANEOUS) IMPLANT
IRRIGATOR SUCT 8 DISP DVNC XI (IRRIGATION / IRRIGATOR) IMPLANT
IRRIGATOR SUCTION 8MM XI DISP (IRRIGATION / IRRIGATOR) ×2
IV NS IRRIG 3000ML ARTHROMATIC (IV SOLUTION) IMPLANT
KIT PINK PAD W/HEAD ARE REST (MISCELLANEOUS) ×2 IMPLANT
KIT PINK PAD W/HEAD ARM REST (MISCELLANEOUS) ×2 IMPLANT
KIT TURNOVER KIT A (KITS) ×2 IMPLANT
LABEL OR SOLS (LABEL) ×2 IMPLANT
MANIFOLD NEPTUNE II (INSTRUMENTS) ×2 IMPLANT
NDL INSUFFLATION 14GA 120MM (NEEDLE) IMPLANT
NEEDLE HYPO 22GX1.5 SAFETY (NEEDLE) ×2 IMPLANT
NEEDLE INSUFFLATION 14GA 120MM (NEEDLE) IMPLANT
NS IRRIG 500ML POUR BTL (IV SOLUTION) ×2 IMPLANT
PACK LAP CHOLECYSTECTOMY (MISCELLANEOUS) ×2 IMPLANT
SEAL CANN UNIV 5-8 DVNC XI (MISCELLANEOUS) ×8 IMPLANT
SEAL XI 5MM-8MM UNIVERSAL (MISCELLANEOUS) ×8
SET TUBE SMOKE EVAC HIGH FLOW (TUBING) ×2 IMPLANT
SOLUTION ELECTROLUBE (MISCELLANEOUS) ×2 IMPLANT
SPIKE FLUID TRANSFER (MISCELLANEOUS) ×2 IMPLANT
SPONGE GAUZE 2X2 8PLY STRL LF (GAUZE/BANDAGES/DRESSINGS) IMPLANT
SUT ETHILON 3-0 FS-10 30 BLK (SUTURE) ×2
SUT MNCRL 4-0 (SUTURE) ×2
SUT MNCRL 4-0 27XMFL (SUTURE) ×2
SUT VICRYL 0 AB UR-6 (SUTURE) ×2 IMPLANT
SUTURE EHLN 3-0 FS-10 30 BLK (SUTURE) IMPLANT
SUTURE MNCRL 4-0 27XMF (SUTURE) ×2 IMPLANT
SYS BAG RETRIEVAL 10MM (BASKET) ×2
SYSTEM BAG RETRIEVAL 10MM (BASKET) ×2 IMPLANT
TROCAR Z-THREAD FIOS 11X100 BL (TROCAR) IMPLANT
WATER STERILE IRR 500ML POUR (IV SOLUTION) ×2 IMPLANT

## 2022-06-26 NOTE — ED Notes (Addendum)
Unit secretary has been paging and calling ( a total of 5 calls) Dr. Christian Mate for the last hour to let him know of the fever and ask if he would like any further orders. Will continue to wait for a response. Charge RN [employer] notified.

## 2022-06-26 NOTE — ED Notes (Signed)
Informed RN bed assigned 

## 2022-06-26 NOTE — Anesthesia Preprocedure Evaluation (Addendum)
Anesthesia Evaluation  Patient identified by MRN, date of birth, ID band Patient awake    Reviewed: Allergy & Precautions, NPO status , Patient's Chart, lab work & pertinent test results  History of Anesthesia Complications Negative for: history of anesthetic complications  Airway Mallampati: III   Neck ROM: Full    Dental  (+) Edentulous Upper, Edentulous Lower   Pulmonary former smoker (quit 1965),    Pulmonary exam normal breath sounds clear to auscultation       Cardiovascular Normal cardiovascular exam Rhythm:Regular Rate:Normal  ECG 06/25/22:  Sinus tachycardia RBBB and LAFB Left ventricular hypertrophy   Neuro/Psych PSYCHIATRIC DISORDERS Depression negative neurological ROS     GI/Hepatic GERD  ,  Endo/Other  Prediabetes   Renal/GU Renal disease (stage III CKD)     Musculoskeletal  (+) Arthritis ,   Abdominal   Peds  Hematology negative hematology ROS (+)   Anesthesia Other Findings   Reproductive/Obstetrics                           Anesthesia Physical Anesthesia Plan  ASA: 3  Anesthesia Plan: General   Post-op Pain Management:    Induction: Intravenous  PONV Risk Score and Plan: 2 and Ondansetron, Dexamethasone and Treatment may vary due to age or medical condition  Airway Management Planned: Oral ETT  Additional Equipment:   Intra-op Plan:   Post-operative Plan: Extubation in OR  Informed Consent: I have reviewed the patients History and Physical, chart, labs and discussed the procedure including the risks, benefits and alternatives for the proposed anesthesia with the patient or authorized representative who has indicated his/her understanding and acceptance.     Dental advisory given and Consent reviewed with POA  Plan Discussed with: CRNA  Anesthesia Plan Comments: (History and consent obtained in Spanish from patient's son at bedside.  Patient's son  consented for risks of anesthesia including but not limited to:  - adverse reactions to medications - damage to eyes, teeth, lips or other oral mucosa - nerve damage due to positioning  - sore throat or hoarseness - damage to heart, brain, nerves, lungs, other parts of body or loss of life  Informed patient's son about role of CRNA in peri- and intra-operative care; he voiced understanding.)       Anesthesia Quick Evaluation

## 2022-06-26 NOTE — Transfer of Care (Addendum)
Immediate Anesthesia Transfer of Care Note  Patient: Bradley Wolf  Procedure(s) Performed: XI ROBOTIC ASSISTED LAPAROSCOPIC CHOLECYSTECTOMY (Abdomen) INDOCYANINE GREEN FLUORESCENCE IMAGING (ICG)  Patient Location: PACU  Anesthesia Type:General  Level of Consciousness: drowsy  Airway & Oxygen Therapy: Patient Spontanous Breathing and Patient connected to face mask oxygen  Post-op Assessment: Report given to RN and Post -op Vital signs reviewed and stable  Post vital signs: Reviewed and stable  Last Vitals:  Vitals Value Taken Time  BP 146/60 06/26/22 1808  Temp 36.4 C 06/26/22 1808  Pulse 78 06/26/22 1812  Resp 19 06/26/22 1812  SpO2 100 % 06/26/22 1812  Vitals shown include unvalidated device data.  Last Pain:  Vitals:   06/26/22 1808  TempSrc:   PainSc: Asleep         Complications: No notable events documented.

## 2022-06-26 NOTE — Interval H&P Note (Signed)
History and Physical Interval Note:  06/26/2022 3:57 PM  Bradley Wolf  has presented today for surgery, with the diagnosis of Acute calculus cholecystitis.  The various methods of treatment have been discussed with the patient and family. After consideration of risks, benefits and other options for treatment, the patient has consented to  Procedure(s): XI ROBOTIC ASSISTED LAPAROSCOPIC CHOLECYSTECTOMY (N/A) [place] (ICG) (N/A) as a surgical intervention.  The patient's history has been reviewed, patient examined, no change in status, stable for surgery.  I have reviewed the patient's chart and labs.  Questions were answered to the patient's satisfaction.     [name]

## 2022-06-26 NOTE — Anesthesia Procedure Notes (Signed)
Procedure Name: Intubation Date/Time: 06/26/2022 4:41 PM  Performed by: Nelda Marseille, CRNAPre-anesthesia Checklist: Patient identified, Patient being monitored, Timeout performed, Emergency Drugs available and Suction available Patient Re-evaluated:Patient Re-evaluated prior to induction Oxygen Delivery Method: Circle system utilized Preoxygenation: Pre-oxygenation with 100% oxygen Induction Type: IV induction Ventilation: Mask ventilation without difficulty Laryngoscope Size: Mac, 3 and McGraph Grade View: Grade I Tube type: Oral Tube size: 7.5 mm Number of attempts: 1 Airway Equipment and Method: Stylet Placement Confirmation: ETT inserted through vocal cords under direct vision, positive ETCO2 and breath sounds checked- equal and bilateral Secured at: 21 cm Tube secured with: Tape Dental Injury: Teeth and Oropharynx as per pre-operative assessment

## 2022-06-26 NOTE — Anesthesia Postprocedure Evaluation (Signed)
Anesthesia Post Note  Patient: Bradley Wolf  Procedure(s) Performed: XI ROBOTIC ASSISTED LAPAROSCOPIC CHOLECYSTECTOMY (Abdomen) INDOCYANINE GREEN FLUORESCENCE IMAGING (ICG)  Patient location during evaluation: PACU Anesthesia Type: General Level of consciousness: awake and alert, oriented and patient cooperative Pain management: pain level controlled Vital Signs Assessment: post-procedure vital signs reviewed and stable Respiratory status: spontaneous breathing, nonlabored ventilation and respiratory function stable Cardiovascular status: blood pressure returned to baseline and stable Postop Assessment: adequate PO intake Anesthetic complications: no   No notable events documented.   Last Vitals:  Vitals:   06/26/22 1815 06/26/22 1830  BP: (!) 145/59 (!) 129/54  Pulse: 78 80  Resp: 18 20  Temp:    SpO2: 100% 96%    Last Pain:  Vitals:   06/26/22 1830  TempSrc:   PainSc: 0-No pain                 Darrin Nipper

## 2022-06-26 NOTE — Op Note (Signed)
Robotic cholecystectomy with Indocyamine Green Ductal Imaging.   Pre-operative Diagnosis: Acute calculus cholecystitis  Post-operative Diagnosis: Gangrenous cholecystitis with cholelithiasis..  Procedure: Robotic assisted laparoscopic cholecystectomy with Indocyamine Green administration but without visualization..   Surgeon: Campbell Lerner, M.D., FACS  Anesthesia: General. with endotracheal tube  Findings: Patchy gangrene of the gallbladder, well defined gallbladder cystic duct junction.  3 clips applied to the viable cystic duct.  Drain left in gallbladder fossa.  Estimated Blood Loss: 50 mL         Drains: None         Specimens: Gallbladder           Complications: none  Procedure Details  The patient was seen again in the Holding Room.  1.25 mg dose of ICG was administered intravenously.   The benefits, complications, treatment options, risks and expected outcomes were again reviewed with the patient. The likelihood of improving the patient's symptoms with return to their baseline status is good.  The patient and/or family concurred with the proposed plan, giving informed consent, again alternatives reviewed.  The patient was taken to Operating Room, identified, and the procedure verified as robotic assisted laparoscopic cholecystectomy.  Prior to the induction of general anesthesia, antibiotic prophylaxis was administered. VTE prophylaxis was in place. General endotracheal anesthesia was then administered and tolerated well. The patient was positioned in the supine position.  After the induction, the abdomen was prepped with Chloraprep and draped in the sterile fashion.  A Time Out was held and the above information confirmed.   Right para-umbilical local infiltration with quarter percent Marcaine with epinephrine is utilized.  Made a 12 mm incision on the right periumbilical site, I advanced an optical 20mm port under direct visualization into the peritoneal cavity.  Once the  peritoneum was penetrated, insufflation was initiated.  The trocar was then advanced into the abdominal cavity under direct visualization. Pneumoperitoneum was then continued utilizing CO2 at 15 mmHg or less and tolerated well without any adverse changes in the patient's vital signs.  Two 8.5-mm ports were placed in the left lower quadrant and laterally, and one to the right lower quadrant, all under direct vision. All skin incisions  were infiltrated with a local anesthetic agent before making the incision and placing the trocars.  The patient was positioned  in reverse Trendelenburg, tilted the patient's left side down.  Da Vinci XI robot was then positioned on to the patient's left side, and docked.  The gallbladder was identified, it was encased in omental wrap, secondarily inflamed by the gangrenous process.  Utilizing an angled scope for visualization and the robotic suction irrigator, I was able to proceed with blunt dissection to mobilize the encasement and to visualize the entire gallbladder.  The fundus was then grasped via the arm 4 Prograsp and retracted cephalad. Adhesions were lysed with scissors and cautery.  Suction irrigator was used rigorously for both blunt dissection and for maintenance of visualization due to the extensive oozing.  The infundibulum was identified grasped and retracted , exposing the peritoneum overlying the triangle of Calot. This was then opened and dissected using blunt dissection with suction irrigator and bipolar cautery & scissors. An extended critical view of the cystic duct was obtained, unfortunately without the aide by the ICG via FireFly which did not assist in the localization of the common bile ductal anatomy.    The cystic duct was clearly identified and dissected to isolation.   Artery well isolated and cauterized with bipolar and divided, the cystic  duct was triple clipped and divided with scissors, as close to the gallbladder neck as feasible, thus  leaving two on the remaining stump.    The gallbladder was taken from the gallbladder fossa in a retrograde fashion with the electrocautery. The gallbladder was removed and placed in an Endocatch bag.  The liver bed is inspected. Hemostasis was confirmed.  The robot was undocked and moved away from the operative field. 3 L normal saline irrigation was utilized and was aspirated clear.  The gallbladder and Endocatch sac were then removed through the infraumbilical port site.  I placed a 19 Blake drain in the gallbladder fossa and withdrew it out the right most port site was secured at the skin with 3-0 nylon. Inspection of the right upper quadrant was performed. No bleeding, bile duct injury or leak, or bowel injury was noted. The infra-umbilical port site fascia was closed with interrumpted 0 Vicryl sutures using PMI/cone under direct visualization. Pneumoperitoneum was released and ports removed.  4-0 subcuticular Monocryl was used to close the skin. Dermabond was  applied.  The patient was then extubated and brought to the recovery room in stable condition. Sponge, lap, and needle counts were correct at closure and at the conclusion of the case.               [name], M.D., [facility] 06/26/2022 5:59 PM

## 2022-06-27 DIAGNOSIS — E872 Acidosis, unspecified: Secondary | ICD-10-CM | POA: Diagnosis present

## 2022-06-27 DIAGNOSIS — K810 Acute cholecystitis: Secondary | ICD-10-CM | POA: Diagnosis present

## 2022-06-27 DIAGNOSIS — I129 Hypertensive chronic kidney disease with stage 1 through stage 4 chronic kidney disease, or unspecified chronic kidney disease: Secondary | ICD-10-CM | POA: Diagnosis present

## 2022-06-27 DIAGNOSIS — K219 Gastro-esophageal reflux disease without esophagitis: Secondary | ICD-10-CM | POA: Diagnosis present

## 2022-06-27 DIAGNOSIS — Z20822 Contact with and (suspected) exposure to covid-19: Secondary | ICD-10-CM | POA: Diagnosis present

## 2022-06-27 DIAGNOSIS — N183 Chronic kidney disease, stage 3 unspecified: Secondary | ICD-10-CM | POA: Diagnosis present

## 2022-06-27 DIAGNOSIS — K7460 Unspecified cirrhosis of liver: Secondary | ICD-10-CM | POA: Diagnosis present

## 2022-06-27 DIAGNOSIS — Z87891 Personal history of nicotine dependence: Secondary | ICD-10-CM | POA: Diagnosis not present

## 2022-06-27 DIAGNOSIS — I4510 Unspecified right bundle-branch block: Secondary | ICD-10-CM | POA: Diagnosis present

## 2022-06-27 DIAGNOSIS — K82A1 Gangrene of gallbladder in cholecystitis: Secondary | ICD-10-CM | POA: Diagnosis present

## 2022-06-27 DIAGNOSIS — E782 Mixed hyperlipidemia: Secondary | ICD-10-CM | POA: Diagnosis present

## 2022-06-27 DIAGNOSIS — N401 Enlarged prostate with lower urinary tract symptoms: Secondary | ICD-10-CM | POA: Diagnosis present

## 2022-06-27 DIAGNOSIS — K8000 Calculus of gallbladder with acute cholecystitis without obstruction: Secondary | ICD-10-CM | POA: Diagnosis present

## 2022-06-27 DIAGNOSIS — R000 Tachycardia, unspecified: Secondary | ICD-10-CM | POA: Diagnosis present

## 2022-06-27 DIAGNOSIS — I340 Nonrheumatic mitral (valve) insufficiency: Secondary | ICD-10-CM | POA: Diagnosis present

## 2022-06-27 LAB — LACTIC ACID, PLASMA
Lactic Acid, Venous: 2.5 mmol/L (ref 0.5–1.9)
Lactic Acid, Venous: 2.2 mmol/L (ref 0.5–1.9)
Lactic Acid, Venous: 3.3 mmol/L (ref 0.5–1.9)
Lactic Acid, Venous: 2.9 mmol/L (ref 0.5–1.9)
Lactic Acid, Venous: 1.8 mmol/L (ref 0.5–1.9)

## 2022-06-27 LAB — COMPREHENSIVE METABOLIC PANEL
ALT: 86 U/L — ABNORMAL HIGH (ref 0–44)
AST: 46 U/L — ABNORMAL HIGH (ref 15–41)
Albumin: 2.8 g/dL — ABNORMAL LOW (ref 3.5–5.0)
Alkaline Phosphatase: 73 U/L (ref 38–126)
Anion gap: 8 (ref 5–15)
BUN: 22 mg/dL (ref 8–23)
CO2: 20 mmol/L — ABNORMAL LOW (ref 22–32)
Calcium: 8.4 mg/dL — ABNORMAL LOW (ref 8.9–10.3)
Chloride: 107 mmol/L (ref 98–111)
Creatinine, Ser: 1.06 mg/dL (ref 0.61–1.24)
GFR, Estimated: >60 mL/min (ref >60)
Glucose, Bld: 196 mg/dL — ABNORMAL HIGH (ref 70–99)
Potassium: 3.9 mmol/L (ref 3.5–5.1)
Sodium: 135 mmol/L (ref 135–145)
Total Bilirubin: 0.8 mg/dL (ref 0.3–1.2)
Total Protein: 6.1 g/dL — ABNORMAL LOW (ref 6.5–8.1)

## 2022-06-27 LAB — CBC
HCT: 36.7 % — ABNORMAL LOW (ref 39.0–52.0)
Hemoglobin: 12.3 g/dL — ABNORMAL LOW (ref 13.0–17.0)
MCH: 28.5 pg (ref 26.0–34.0)
MCHC: 33.5 g/dL (ref 30.0–36.0)
MCV: 85 fL (ref 80.0–100.0)
Platelets: 105 10*3/uL — ABNORMAL LOW (ref 150–400)
RBC: 4.32 MIL/uL (ref 4.22–5.81)
RDW: 14 % (ref 11.5–15.5)
WBC: 15.4 10*3/uL — ABNORMAL HIGH (ref 4.0–10.5)
nRBC: 0 % (ref 0.0–0.2)

## 2022-06-27 MED ORDER — KETOROLAC TROMETHAMINE 15 MG/ML IJ SOLN: 15.0000 mg | Freq: Four times a day (QID) | INTRAMUSCULAR | Status: DC | PRN

## 2022-06-27 MED ORDER — OXYCODONE HCL 5 MG PO TABS
5.0000 mg | ORAL_TABLET | ORAL | Status: DC | PRN
  Filled 2022-06-27: qty 1

## 2022-06-27 MED ORDER — SODIUM CHLORIDE 0.9 % IV SOLN
2.0000 g | INTRAVENOUS | Status: DC
  Administered 2022-06-27: 2 g via INTRAVENOUS
  Filled 2022-06-27: qty 2
  Filled 2022-06-27: qty 20

## 2022-06-27 NOTE — TOC Initial Note (Signed)
Transition of Care [facility]) - Initial/Assessment Note    Patient Details  Name: Bradley Wolf MRN: [id number] Date of Birth: 16-Feb-1929  [facility] of [facility]) CM/SW Contact:    Beverly Sessions, RN Phone Number: 06/27/2022, 9:04 AM  Clinical Narrative:                   Transition of Care [facility]) Screening Note   Patient Details  Name: Bradley Wolf Date of Birth: 1929/01/10   [facility] of [facility]) CM/SW Contact:    Beverly Sessions, RN Phone Number: 06/27/2022, 9:04 AM    Transition of Care Department (TOC) has reviewed patient and no TOC needs have been identified at this time. We will continue to monitor patient advancement through interdisciplinary progression rounds. If new patient transition needs arise, please place a TOC consult.         Patient Goals and CMS Choice        Expected Discharge Plan and Services                                                Prior Living Arrangements/Services                       Activities of Daily Living Home Assistive Devices/Equipment: None, Dentures (specify type), Eyeglasses ADL Screening (condition at time of admission) Patient's cognitive ability adequate to safely complete daily activities?: Yes Is the patient deaf or have difficulty hearing?: No Does the patient have difficulty seeing, even when wearing glasses/contacts?: No Does the patient have difficulty concentrating, remembering, or making decisions?: No Patient able to express need for assistance with ADLs?: Yes Does the patient have difficulty dressing or bathing?: No Independently performs ADLs?: Yes (appropriate for developmental age) Does the patient have difficulty walking or climbing stairs?: No Weakness of Legs: None Weakness of Arms/Hands: None  Permission Sought/Granted                  Emotional Assessment              Admission diagnosis:  Right upper quadrant abdominal pain  [R10.11] Acute cholecystitis due to biliary calculus [K80.00] Acute cholecystitis [K81.0] Patient Active Problem List   Diagnosis Date Noted   Acute cholecystitis 06/27/2022   Right upper quadrant abdominal pain    Acute cholecystitis due to biliary calculus 06/25/2022   Moderate episode of recurrent major depressive disorder ([place]) 10/20/2019   BPH with obstruction/lower urinary tract symptoms 08/10/2015   Elevated PSA 08/10/2015   Mixed hyperlipidemia 03/15/2015   Moderate mitral insufficiency 03/15/2015   CKD (chronic kidney disease) stage 3, GFR 30-59 ml/min ([place]) 03/19/2014   PCP:  Kirk Ruths, MD Pharmacy:   [facility] [address] [place] [address] [place] ([place]) [place] [address] Phone: [phone] Fax: [phone]  [facility] [id number], [place] [address] Phone: [phone] Fax: [phone]  [facility] [address] [place] [address] Phone: [phone] Fax: [phone]     Social Determinants of Health (SDOH) Interventions    Readmission Risk Interventions     No data to display

## 2022-06-27 NOTE — Progress Notes (Signed)
[facility] SUR[facility]ICAL PROGRESS NOTE  Hospital Day(s): 2.   Post op day(s): 1 Day Post-Op.   Interval History:  Patient seen and examined No acute events or new complaints overnight.  Patient reports he is doing well; no significant abdominal pain No fever, chills, nausea, emesis Leukocytosis improved this morning; 15.4K Renal function normal; sCr - 1.06; UO - unmeasured No electrolyte derangements He does have mild lactic acidosis; 2.5 Surgical drain with 70 ccs; serosanguinous  On heart healthy diet Continues on Flagyl   Vital signs in last 24 hours: [min-max] current  Temp:  [97.4 F (36.3 C)-98.6 F (37 C)] 97.7 F (36.5 C) (09/21 0735) Pulse Rate:  [75-96] 75 (09/21 0735) Resp:  [15-22] 16 (09/21 0735) BP: (105-146)/(51-71) 119/66 (09/21 0735) SpO2:  [93 %-100 %] 99 % (09/21 0744)     Height: 5\' 2"  (157.5 cm) Weight: 67.6 kg BMI (Calculated): 27.25   Intake/Output last 2 shifts:  09/20 0701 - 09/21 0700 In: 1783.8 [P.O.:240; I.V.:1343.8; IV Piggyback:200] Out: 69 [Drains:70; Blood:1]   Physical Exam:  Constitutional: alert, cooperative and no distress  Respiratory: breathing non-labored at rest  Cardiovascular: regular rate and sinus rhythm  Gastrointestinal: soft, non-tender, and non-distended. No rebound/guarding. Surgical drain in right lateral port; output serosanguinous Integumentary: Laparoscopic incisions are CDI with dermabond, no erythema or drainage   Labs:     Latest Ref Rng & Units 06/27/2022    3:59 AM 06/25/2022   10:22 AM 05/09/2022   10:44 AM  CBC  WBC 4.0 - 10.5 K/uL 15.4  16.3  10.1   Hemoglobin 13.0 - 17.0 g/dL 07/09/2022  35.4  65.6   Hematocrit 39.0 - 52.0 % 36.7  43.0  40.0   Platelets 150 - 400 K/uL 105  142  214       Latest Ref Rng & Units 06/27/2022    3:59 AM 06/25/2022   10:22 AM 05/09/2022   10:44 AM  CMP  Glucose 70 - 99 mg/dL 07/09/2022  751  700   BUN 8 - 23 mg/dL 22  25  21    Creatinine 0.61 - 1.24 mg/dL 174   9.44    Sodium 135 - 145 mmol/L 135  132  137   Potassium 3.5 - 5.1 mmol/L 3.9  4.3  4.2   Chloride 98 - 111 mmol/L 107  98  107   CO2 22 - 32 mmol/L 20  24  21    Calcium 8.9 - 10.3 mg/dL 8.4  8.6  9.3   Total Protein 6.5 - 8.1 g/dL 6.1  7.0  7.8   Total Bilirubin 0.3 - 1.2 mg/dL 0.8  0.7  0.7   Alkaline Phos 38 - 126 U/L 73  71  60   AST 15 - 41 U/L 46  36  22   ALT 0 - 44 U/L 86  35  20     Imaging studies: No new pertinent imaging studies   Assessment/Plan: 88 y.o. male 1 Day Post-Op s/p robotic assisted laparoscopic cholecystectomy for acute cholecystitis with gangrenous areas.   - Okay to continue regular diet; reviewed dietary recommendations   - Continue IV ABx (Flagyl) - Continue IVF; If lactic acidosis resolves, we can stop these    - Continue surgical drain; monitor and record output. Will need drain teaching for home   - Monitor abdominal examination  - Pain control prn; antiemetics prn  - Okay to mobilize as tolerated   - Discharge Planning: Given the gangrenous component  of his gallbladder and advanced age, I think he will best be served with an additional 24 hours in the hospital for IV Abx and to ensure lactic acidosis resolves. Will plan for discharge tomorrow (09/22).   With help of spanish-english medical interpreter, all of the above findings and recommendations were discussed with the patient, patient's family (son - at bedside), and the medical team, and all of patient's and family's questions were answered to their expressed satisfaction.  -- Edison Simon, PA-C [facility] 06/27/2022, 8:03 AM M-F: 7am - 4pm

## 2022-06-28 LAB — COMPREHENSIVE METABOLIC PANEL
ALT: 63 U/L — ABNORMAL HIGH (ref 0–44)
AST: 26 U/L (ref 15–41)
Albumin: 2.5 g/dL — ABNORMAL LOW (ref 3.5–5.0)
Alkaline Phosphatase: 68 U/L (ref 38–126)
Anion gap: 5 (ref 5–15)
BUN: 26 mg/dL — ABNORMAL HIGH (ref 8–23)
CO2: 24 mmol/L (ref 22–32)
Calcium: 8.1 mg/dL — ABNORMAL LOW (ref 8.9–10.3)
Chloride: 108 mmol/L (ref 98–111)
Creatinine, Ser: 0.87 mg/dL (ref 0.61–1.24)
GFR, Estimated: >60 mL/min (ref >60)
Glucose, Bld: 138 mg/dL — ABNORMAL HIGH (ref 70–99)
Potassium: 4.3 mmol/L (ref 3.5–5.1)
Sodium: 137 mmol/L (ref 135–145)
Total Bilirubin: 0.4 mg/dL (ref 0.3–1.2)
Total Protein: 5.6 g/dL — ABNORMAL LOW (ref 6.5–8.1)

## 2022-06-28 LAB — CBC
HCT: 33.6 % — ABNORMAL LOW (ref 39.0–52.0)
Hemoglobin: 11.6 g/dL — ABNORMAL LOW (ref 13.0–17.0)
MCH: 29.4 pg (ref 26.0–34.0)
MCHC: 34.5 g/dL (ref 30.0–36.0)
MCV: 85.1 fL (ref 80.0–100.0)
Platelets: 115 10*3/uL — ABNORMAL LOW (ref 150–400)
RBC: 3.95 MIL/uL — ABNORMAL LOW (ref 4.22–5.81)
RDW: 14 % (ref 11.5–15.5)
WBC: 12.2 10*3/uL — ABNORMAL HIGH (ref 4.0–10.5)
nRBC: 0 % (ref 0.0–0.2)

## 2022-06-28 MED ORDER — AMOXICILLIN-POT CLAVULANATE 875-125 MG PO TABS: 1.0000 | ORAL_TABLET | Freq: Two times a day (BID) | ORAL | 0 refills | Status: DC

## 2022-06-28 MED ORDER — IBUPROFEN 600 MG PO TABS: 600.0000 mg | ORAL_TABLET | Freq: Four times a day (QID) | ORAL | 0 refills | Status: AC | PRN

## 2022-06-28 MED ORDER — OXYCODONE HCL 5 MG PO TABS: 5.0000 mg | ORAL_TABLET | Freq: Four times a day (QID) | ORAL | 0 refills | Status: DC | PRN

## 2022-06-28 NOTE — Discharge Instructions (Addendum)
In addition to included general post-operative instructions,  Diet: Resume home diet. Recommend avoiding or limiting fatty/greasy foods over the next few days/week. If you do eat these, you may (or may not) notice diarrhea. This is expected while your body adjusts to not having a gallbladder, and it typically resolves with time.    Activity: No heavy lifting >20 pounds (children, pets, laundry, garbage) or strenuous activity until for 4 weeks, but light activity and walking are encouraged. Do not drive or drink alcohol if taking narcotic pain medications or having pain that might distract from driving.  Wound care: If you can keep drain site covered, you may shower/get incision wet with soapy water and pat dry (do not rub incisions), but no baths or submerging incision underwater until follow-up.   Drain: Monitor and record drain output daily; hand out given.   Constipation: Okay to use Miralax or stool softener (he has Senakot at home) for constipation. Would recommend using this if taking the narcotic pain medications. If he has diarrhea, would stop these medications.   Medications: Resume all home medications except those noted on DC summary. For mild to moderate pain: acetaminophen (Tylenol) or ibuprofen/naproxen (if no kidney disease). Combining Tylenol with alcohol can substantially increase your risk of causing liver disease. Narcotic pain medications, if prescribed, can be used for severe pain, though may cause nausea, constipation, and drowsiness. Do not combine Tylenol and Percocet (or similar) within a 6 hour period as Percocet (and similar) contain(s) Tylenol. If you do not need the narcotic pain medication, you do not need to fill the prescription.  Call office [phone]) at any time if any questions, worsening pain, fevers/chills, bleeding, drainage from incision site, or if fluid in the drain is dark green

## 2022-06-28 NOTE — Care Management Important Message (Signed)
Important Message  Patient Details  Name: Bradley Wolf MRN: [id number] Date of Birth: Aug 16, 1929   Medicare Important Message Given:  Yes     Dannette Barbara 06/28/2022, 11:16 AM

## 2022-06-28 NOTE — Progress Notes (Signed)
Pt discharged per MD order. IV removed. Discharge instructions reviewed with pt and his son. Pt and son educated on how to care for and empty JP drain. All questions answered to pt and son satisfaction. Pt taken out in wheelchair by staff.

## 2022-06-28 NOTE — Discharge Summary (Signed)
[facility] SURGICAL DISCHARGE SUMMARY  Patient ID: Bradley Wolf MRN: [id number] DOB/AGE: [birth year] 88 y.o.  Admit date: 06/25/2022 Discharge date: 06/28/2022  Discharge Diagnoses Patient Active Problem List   Diagnosis Date Noted   Acute cholecystitis 06/27/2022    Consultants None  Procedures 06/26/2022:  Robotic assisted laparoscopic cholecystectomy   HPI / Hospital Course: 88 y.o. male presented to [facility] ED for abdominal pain. Workup was found to be significant for acute cholecystitis. Informed consent was obtained and documented, and patient underwent robotic assisted laparoscopic cholecystectomy and found to have gangrenous cholecystitis (Dr Christian Mate, 06/26/2022).  Post-operatively, patient did reasonably well. Interestingly, he did have a lactic acidosis which cleared with IVF resuscitation. On POD2, he was doing very well and ready for discharge. Advancement of patient's diet and ambulation were well-tolerated. The remainder of patient's hospital course was essentially unremarkable, and discharge planning was initiated accordingly with patient safely able to be discharged home with appropriate discharge instructions, antibiotics (Augmentin x7 days), pain control, and outpatient follow-up after all of his and his son's questions were answered to their expressed satisfaction.   Discharge Condition: Good   Physical Examination:  Constitutional: alert, cooperative and no distress  Respiratory: breathing non-labored at rest  Cardiovascular: regular rate and sinus rhythm  Gastrointestinal: soft, non-tender, and non-distended. No rebound/guarding. Surgical drain in right lateral port; output serosanguinous Integumentary: Laparoscopic incisions are CDI with dermabond, no erythema or drainage    Allergies as of 06/28/2022   No Known Allergies      Medication List     STOP taking these medications    acetaminophen 325 MG tablet Commonly known as:  TYLENOL   famotidine 20 MG tablet Commonly known as: Pepcid   Flovent Diskus 100 MCG/ACT Aepb Generic drug: Fluticasone Propionate (Inhal)   fluticasone 50 MCG/ACT nasal spray Commonly known as: FLONASE   gabapentin 100 MG capsule Commonly known as: NEURONTIN   lidocaine 2 % solution Commonly known as: XYLOCAINE   Opcon-A 0.027-0.315 % Soln Generic drug: Naphazoline-Pheniramine   sucralfate 1 g tablet Commonly known as: CARAFATE   tadalafil 5 MG tablet Commonly known as: CIALIS   Up4 Probiotics Ultra Caps   Vitamin B 12 500 MCG Tabs       TAKE these medications    amitriptyline 10 MG tablet Commonly known as: ELAVIL Take 20 mg by mouth at bedtime.   amoxicillin-clavulanate 875-125 MG tablet Commonly known as: AUGMENTIN Take 1 tablet by mouth 2 (two) times daily for 7 days.   finasteride 5 MG tablet Commonly known as: Proscar Take 1 tablet (5 mg total) by mouth daily.   ibuprofen 600 MG tablet Commonly known as: ADVIL Take 1 tablet (600 mg total) by mouth every 6 (six) hours as needed.   oxyCODONE 5 MG immediate release tablet Commonly known as: Oxy IR/ROXICODONE Take 1 tablet (5 mg total) by mouth every 6 (six) hours as needed for severe pain or breakthrough pain.   pantoprazole 40 MG tablet Commonly known as: PROTONIX Take by mouth.   predniSONE 20 MG tablet Commonly known as: DELTASONE Take 20 mg by mouth 2 (two) times daily.   risperiDONE 0.5 MG tablet Commonly known as: RISPERDAL Take 0.5 mg by mouth 2 (two) times daily.   senna 8.6 MG Tabs tablet Commonly known as: SENOKOT Take 1 tablet by mouth.   tamsulosin 0.4 MG Caps capsule Commonly known as: FLOMAX Take 1 capsule by mouth once daily  Follow-up Information     [name], MD. Schedule an appointment as soon as possible for a visit in 1 week(s).   Specialty: General Surgery Why: s/p lap cholecystectomy with drain....okay to see Thedore Mins if needed Contact  information: [address] [place] [address] [phone]                  Time spent on discharge management including discussion of hospital course, clinical condition, outpatient instructions, prescriptions, and follow up with the patient and members of the medical team: >30 minutes  -- Edison Simon , PA-C [facility]  06/28/2022, 9:32 AM [phone] M-F: 7am - 4pm

## 2022-06-29 ENCOUNTER — Encounter: Payer: Self-pay | Admitting: Intensive Care

## 2022-06-29 ENCOUNTER — Emergency Department
Admission: EM | Admit: 2022-06-29 | Discharge: 2022-06-29 | Disposition: A | Discharge status: Home / Self Care | Payer: Medicare Other | Attending: Emergency Medicine | Admitting: Emergency Medicine

## 2022-06-29 ENCOUNTER — Emergency Department: Payer: Medicare Other

## 2022-06-29 ENCOUNTER — Other Ambulatory Visit: Payer: Self-pay

## 2022-06-29 DIAGNOSIS — Z9049 Acquired absence of other specified parts of digestive tract: Secondary | ICD-10-CM | POA: Insufficient documentation

## 2022-06-29 DIAGNOSIS — N189 Chronic kidney disease, unspecified: Secondary | ICD-10-CM | POA: Insufficient documentation

## 2022-06-29 DIAGNOSIS — K5900 Constipation, unspecified: Secondary | ICD-10-CM | POA: Insufficient documentation

## 2022-06-29 LAB — URINALYSIS, ROUTINE W REFLEX MICROSCOPIC
Bilirubin Urine: NEGATIVE
Glucose, UA: NEGATIVE mg/dL
Hgb urine dipstick: NEGATIVE
Ketones, ur: NEGATIVE mg/dL
Leukocytes,Ua: NEGATIVE
Nitrite: NEGATIVE
Protein, ur: NEGATIVE mg/dL
Specific Gravity, Urine: 1.015 (ref 1.005–1.030)
pH: 6 (ref 5.0–8.0)

## 2022-06-29 LAB — CBC WITH DIFFERENTIAL/PLATELET
Abs Immature Granulocytes: 0.03 10*3/uL (ref 0.00–0.07)
Basophils Absolute: 0 10*3/uL (ref 0.0–0.1)
Basophils Relative: 0 %
Eosinophils Absolute: 0.2 10*3/uL (ref 0.0–0.5)
Eosinophils Relative: 2 %
HCT: 41.1 % (ref 39.0–52.0)
Hemoglobin: 13.8 g/dL (ref 13.0–17.0)
Immature Granulocytes: 0 %
Lymphocytes Relative: 17 %
Lymphs Abs: 1.5 10*3/uL (ref 0.7–4.0)
MCH: 28.8 pg (ref 26.0–34.0)
MCHC: 33.6 g/dL (ref 30.0–36.0)
MCV: 85.6 fL (ref 80.0–100.0)
Monocytes Absolute: 0.5 10*3/uL (ref 0.1–1.0)
Monocytes Relative: 6 %
Neutro Abs: 6.3 10*3/uL (ref 1.7–7.7)
Neutrophils Relative %: 75 %
Platelets: 144 10*3/uL — ABNORMAL LOW (ref 150–400)
RBC: 4.8 MIL/uL (ref 4.22–5.81)
RDW: 13.9 % (ref 11.5–15.5)
WBC: 8.5 10*3/uL (ref 4.0–10.5)
nRBC: 0 % (ref 0.0–0.2)

## 2022-06-29 LAB — COMPREHENSIVE METABOLIC PANEL
ALT: 66 U/L — ABNORMAL HIGH (ref 0–44)
AST: 42 U/L — ABNORMAL HIGH (ref 15–41)
Albumin: 3 g/dL — ABNORMAL LOW (ref 3.5–5.0)
Alkaline Phosphatase: 78 U/L (ref 38–126)
Anion gap: 5 (ref 5–15)
BUN: 31 mg/dL — ABNORMAL HIGH (ref 8–23)
CO2: 23 mmol/L (ref 22–32)
Calcium: 8.2 mg/dL — ABNORMAL LOW (ref 8.9–10.3)
Chloride: 106 mmol/L (ref 98–111)
Creatinine, Ser: 1 mg/dL (ref 0.61–1.24)
GFR, Estimated: >60 mL/min (ref >60)
Glucose, Bld: 95 mg/dL (ref 70–99)
Potassium: 4.4 mmol/L (ref 3.5–5.1)
Sodium: 134 mmol/L — ABNORMAL LOW (ref 135–145)
Total Bilirubin: 1.1 mg/dL (ref 0.3–1.2)
Total Protein: 6.6 g/dL (ref 6.5–8.1)

## 2022-06-29 NOTE — Discharge Instructions (Signed)
Continue the miralax daily for 5 more days.  Drink fluids.  REturn if worsening

## 2022-06-29 NOTE — ED Notes (Signed)
Patient ambulated with slow, steady gait to hallway bathroom with one person assist by his son. Patient felt he needed to have a bowel movement.

## 2022-06-29 NOTE — ED Provider Notes (Signed)
[facility] Provider Note    Event Date/Time   First MD Initiated Contact with Patient 06/29/22 1337     (approximate)   History   Constipation   HPI  Bradley Wolf is a 88 y.o. male with history of CKD, elevated PSA, BPH and recent cholecystectomy 3 days ago presents emergency department constipation.  Patient states he has not been able to have a bowel movement in at least 3 days.  States even the days before he had surgery he had not had a bowel movement.  Complaining of abdominal pain.  No fever or chills.  No chest pain or shortness of breath.  Did take MiraLAX without any relief      Physical Exam   Triage Vital Signs: ED Triage Vitals  Enc Vitals Group     BP 06/29/22 1331 (!) 147/84     Pulse Rate 06/29/22 1331 95     Resp 06/29/22 1331 18     Temp 06/29/22 1331 98.5 F (36.9 C)     Temp Source 06/29/22 1331 Oral     SpO2 06/29/22 1331 93 %     Weight 06/29/22 1332 148 lb (67.1 kg)     Height 06/29/22 1332 5\' 2"  (1.575 m)     Head Circumference --      Peak Flow --      Pain Score 06/29/22 1332 0     Pain Loc --      Pain Edu? --      Excl. in GC? --     Most recent vital signs: Vitals:   06/29/22 1331  BP: (!) 147/84  Pulse: 95  Resp: 18  Temp: 98.5 F (36.9 C)  SpO2: 93%     General: Awake, no distress.   CV:  Good peripheral perfusion. regular rate and  rhythm Resp:  Normal effort. Lungs cta Abd:  No distention.  Mildly tender, decreased bowel sounds all 4 quads Other:      ED Results / Procedures / Treatments   Labs (all labs ordered are listed, but only abnormal results are displayed) Labs Reviewed  CBC WITH DIFFERENTIAL/PLATELET - Abnormal; Notable for the following components:      Result Value   Platelets 144 (*)    All other components within normal limits  COMPREHENSIVE METABOLIC PANEL - Abnormal; Notable for the following components:   Sodium 134 (*)    BUN 31 (*)    Calcium 8.2 (*)    Albumin  3.0 (*)    AST 42 (*)    ALT 66 (*)    All other components within normal limits  URINALYSIS, ROUTINE W REFLEX MICROSCOPIC     EKG     RADIOLOGY Xray abdomen    PROCEDURES:   Procedures   MEDICATIONS ORDERED IN ED: Medications - No data to display   IMPRESSION / MDM / ASSESSMENT AND PLAN / ED COURSE  I reviewed the triage vital signs and the nursing notes.                              Differential diagnosis includes, but is not limited to, ileus, SBO, constipation  Patient's presentation is most consistent with acute presentation with potential threat to life or bodily function.   Basic labs   X-ray of the abdomen 1 view to assess for constipation/bowel obstruction/ileus independently reviewed by me and interpreted by me as being negative.  Confirmed by radiology  CBC was reassuring  Patient has been able to have 2 bowel movements while here in the ED.  Will not do further imaging as he is moving the bowels at this time.  Urinalysis and metabolic panel are both normal.  Patient continues to be stable.  He had another bowel movement while here in the ED.  Explained to his son that they could go to 1 cup of the MiraLAX since he is moving his bowels.  Follow-up with the surgeon further recheck appointment on Thursday.  Return emergency department worsening.  They are in agreement with treatment plan.  He was discharged stable condition.   FINAL CLINICAL IMPRESSION(S) / ED DIAGNOSES   Final diagnoses:  Constipation, unspecified constipation type     Rx / DC Orders   ED Discharge Orders     None        Note:  This document was prepared using Dragon voice recognition software and may include unintentional dictation errors.    Versie Starks, PA-C 06/29/22 1705    Blake Divine, MD 06/29/22 [phone]

## 2022-06-29 NOTE — ED Notes (Signed)
Interpreter request placed 

## 2022-06-29 NOTE — ED Triage Notes (Signed)
Interpretor in person used for triage. Patient c/o constipation. Last BM X3 days ago. Gallbladder removed Wednesday afternoon and drainage bulb present.

## 2022-07-02 ENCOUNTER — Telehealth: Payer: Self-pay | Admitting: *Deleted

## 2022-07-02 LAB — SURGICAL PATHOLOGY

## 2022-07-02 NOTE — Telephone Encounter (Signed)
Spoke with patient's son- per Dr.Rodenberg he may stop the antibiotic .

## 2022-07-02 NOTE — Telephone Encounter (Signed)
Patient called and stated that he was given amoxicillin but every time he takes it, its making him feel very weak and cant get up out of bed. He wants to know if he should stop taking it? He started it on 06/28/22 and is suppose to stop taking it on 07/05/22.   Patient had surgery on 06/26/22 Dr Christian Mate Laparoscopic Cholecystectomy

## 2022-07-04 ENCOUNTER — Encounter: Payer: Self-pay | Admitting: Surgery

## 2022-07-04 ENCOUNTER — Ambulatory Visit (INDEPENDENT_AMBULATORY_CARE_PROVIDER_SITE_OTHER): Payer: Medicare Other | Admitting: Surgery

## 2022-07-04 VITALS — BP 145/72 | HR 103 | Temp 99.1°F | Ht 62.0 in | Wt 149.0 lb

## 2022-07-04 DIAGNOSIS — Z09 Encounter for follow-up examination after completed treatment for conditions other than malignant neoplasm: Secondary | ICD-10-CM

## 2022-07-04 DIAGNOSIS — Z9049 Acquired absence of other specified parts of digestive tract: Secondary | ICD-10-CM

## 2022-07-04 DIAGNOSIS — K8000 Calculus of gallbladder with acute cholecystitis without obstruction: Secondary | ICD-10-CM

## 2022-07-04 NOTE — Progress Notes (Signed)
[facility] POST-OP OFFICE VISIT  07/04/2022  HPI: Bradley Wolf is a 88 y.o. male 8 days s/p robotic cholecystectomy.  He had severe acute cholecystitis with gangrenous changes.  His drain output has been's serous.  He denies fevers and chills, wanted to discontinue his antibiotics due to side effects.  Reports a good appetite, denies diarrhea or constipation.  Reports he is depressed.  Vital signs: BP (!) 145/72   Pulse (!) 103   Temp 99.1 F (37.3 C)   Ht 5\' 2"  (1.575 m)   Wt 149 lb (67.6 kg)   SpO2 98%   BMI 27.25 kg/m    Physical Exam: Constitutional: He appears at his baseline. Abdomen: Soft benign nontender.  Right upper quadrant change drain removed.  Dressing applied. Skin: Incisions are clean dry and intact with Dermabond intact.  Assessment/Plan: This is a 88 y.o. male 8 days s/p robotic cholecystectomy for gangrenous cholecystitis.  Patient Active Problem List   Diagnosis Date Noted   Acute cholecystitis 06/27/2022   Right upper quadrant abdominal pain    Acute cholecystitis due to biliary calculus 06/25/2022   Moderate episode of recurrent major depressive disorder ([place]) 10/20/2019   BPH with obstruction/lower urinary tract symptoms 08/10/2015   Elevated PSA 08/10/2015   Mixed hyperlipidemia 03/15/2015   Moderate mitral insufficiency 03/15/2015   CKD (chronic kidney disease) stage 3, GFR 30-59 ml/min (HCC) 03/19/2014    -Remove drain.  Advised patient may shower and replace the dressing until wound is closed.  We will have them follow-up in a month or as needed.   [name] M.D., FACS 07/04/2022, 4:22 PM

## 2022-07-04 NOTE — Patient Instructions (Signed)

## 2022-07-29 ENCOUNTER — Ambulatory Visit: Payer: Medicare Other | Attending: Cardiology | Admitting: Cardiology

## 2022-07-30 ENCOUNTER — Encounter: Payer: Self-pay | Admitting: Cardiology

## 2022-08-07 NOTE — Progress Notes (Deleted)
[facility] POST-OP OFFICE VISIT  08/07/2022  HPI: Bradley Wolf is a 88 y.o. male 8 days s/p robotic cholecystectomy.  He had severe acute cholecystitis with gangrenous changes.  His drain output has been's serous.  He denies fevers and chills, wanted to discontinue his antibiotics due to side effects.  Reports a good appetite, denies diarrhea or constipation.  Reports he is depressed.  Vital signs: There were no vitals taken for this visit.   Physical Exam: Constitutional: He appears at his baseline. Abdomen: Soft benign nontender.  Right upper quadrant change drain removed.  Dressing applied. Skin: Incisions are clean dry and intact with Dermabond intact.  Assessment/Plan: This is a 88 y.o. male 8 days s/p robotic cholecystectomy for gangrenous cholecystitis.  Patient Active Problem List   Diagnosis Date Noted   Status post laparoscopic cholecystectomy 07/04/2022   Acute cholecystitis due to biliary calculus 06/25/2022   Moderate episode of recurrent major depressive disorder ([place]) 10/20/2019   BPH with obstruction/lower urinary tract symptoms 08/10/2015   Elevated PSA 08/10/2015   Mixed hyperlipidemia 03/15/2015   Moderate mitral insufficiency 03/15/2015   CKD (chronic kidney disease) stage 3, GFR 30-59 ml/min (HCC) 03/19/2014    -Remove drain.  Advised patient may shower and replace the dressing until wound is closed.  We will have them follow-up in a month or as needed.   [name] M.D., FACS 08/07/2022, 9:38 PM

## 2022-08-08 ENCOUNTER — Encounter: Payer: Medicare Other | Admitting: Surgery

## 2022-08-23 ENCOUNTER — Other Ambulatory Visit
Admission: RE | Admit: 2022-08-23 | Discharge: 2022-08-23 | Disposition: A | Discharge status: Home / Self Care | Payer: Medicare Other | Source: Ambulatory Visit | Attending: Student | Admitting: Student

## 2022-08-23 DIAGNOSIS — R600 Localized edema: Secondary | ICD-10-CM | POA: Diagnosis present

## 2022-08-23 LAB — BRAIN NATRIURETIC PEPTIDE: B Natriuretic Peptide: 73.2 pg/mL (ref 0.0–100.0)

## 2022-12-27 ENCOUNTER — Other Ambulatory Visit: Payer: Self-pay | Admitting: Urology

## 2022-12-27 ENCOUNTER — Telehealth: Payer: Self-pay | Admitting: *Deleted

## 2022-12-27 DIAGNOSIS — N401 Enlarged prostate with lower urinary tract symptoms: Secondary | ICD-10-CM

## 2022-12-27 MED ORDER — TADALAFIL 5 MG PO TABS: 5.0000 mg | ORAL_TABLET | Freq: Every day | ORAL | 3 refills | Status: DC | PRN

## 2022-12-27 NOTE — Telephone Encounter (Signed)
Son calling asking why Cialis was taking off med list, per son he takes this everyday and he only have a few tablets left. I advised son that 06/2022 at the ER Cialis was taken off med list. Son states he needs this mediation. Please advise if pt should be on this.

## 2022-12-30 ENCOUNTER — Other Ambulatory Visit: Payer: Self-pay | Admitting: *Deleted

## 2022-12-30 DIAGNOSIS — N138 Other obstructive and reflux uropathy: Secondary | ICD-10-CM

## 2022-12-30 MED ORDER — TADALAFIL 5 MG PO TABS: 5.0000 mg | ORAL_TABLET | Freq: Every day | ORAL | 3 refills | Status: DC | PRN

## 2023-03-02 ENCOUNTER — Other Ambulatory Visit: Payer: Self-pay

## 2023-03-02 ENCOUNTER — Emergency Department
Admission: EM | Admit: 2023-03-02 | Discharge: 2023-03-02 | Disposition: A | Discharge status: Home / Self Care | Payer: Medicare Other | Attending: Emergency Medicine | Admitting: Emergency Medicine

## 2023-03-02 ENCOUNTER — Emergency Department: Payer: Medicare Other

## 2023-03-02 DIAGNOSIS — N189 Chronic kidney disease, unspecified: Secondary | ICD-10-CM | POA: Insufficient documentation

## 2023-03-02 DIAGNOSIS — R051 Acute cough: Secondary | ICD-10-CM

## 2023-03-02 DIAGNOSIS — R059 Cough, unspecified: Secondary | ICD-10-CM | POA: Diagnosis present

## 2023-03-02 MED ORDER — BENZONATATE 100 MG PO CAPS
100.0000 mg | ORAL_CAPSULE | Freq: Once | ORAL | Status: AC
  Administered 2023-03-02: 100 mg via ORAL
  Filled 2023-03-02: qty 1

## 2023-03-02 MED ORDER — BENZONATATE 100 MG PO CAPS: 100.0000 mg | ORAL_CAPSULE | Freq: Four times a day (QID) | ORAL | 0 refills | Status: DC | PRN

## 2023-03-02 NOTE — ED Triage Notes (Signed)
Pt to ED from home via POV for a cough x 2 days that is non-productive. Pt is CAOX4, in no acute distress and ambulatory in triage.

## 2023-03-02 NOTE — ED Provider Notes (Signed)
   [facility] Provider Note    Event Date/Time   First MD Initiated Contact with Patient 03/02/23 1608     (approximate)   History   Cough (X2 days)   HPI  Bradley Wolf is a 89 y.o. male with a history of CKD, who presents with complaints of a cough x 2 days.  He denies fevers or chills.  No chest pain reported.  Nonproductive cough     Physical Exam   Triage Vital Signs: ED Triage Vitals  Enc Vitals Group     BP 03/02/23 1559 (!) 126/58     Pulse Rate 03/02/23 1556 78     Resp 03/02/23 1556 16     Temp 03/02/23 1556 98.4 F (36.9 C)     Temp Source 03/02/23 1556 Oral     SpO2 03/02/23 1556 95 %     Weight 03/02/23 1557 67 kg (147 lb 11.3 oz)     Height 03/02/23 1557 1.575 m (5\' 2" )     Head Circumference --      Peak Flow --      Pain Score 03/02/23 1557 0     Pain Loc --      Pain Edu? --      Excl. in GC? --     Most recent vital signs: Vitals:   03/02/23 1556 03/02/23 1559  BP:  (!) 126/58  Pulse: 78   Resp: 16   Temp: 98.4 F (36.9 C)   SpO2: 95%      General: Awake, no distress.  CV:  Good peripheral perfusion.  Resp:  Normal effort.  Clear to auscultation bilaterally Abd:  No distention.  Other:     ED Results / Procedures / Treatments   Labs (all labs ordered are listed, but only abnormal results are displayed) Labs Reviewed - No data to display   EKG    RADIOLOGY Chest x-ray viewed interpreted by me, no acute abnormality    PROCEDURES:  Critical Care performed:   Procedures   MEDICATIONS ORDERED IN ED: Medications  benzonatate (TESSALON) capsule 100 mg (100 mg Oral Given 03/02/23 1743)     IMPRESSION / MDM / ASSESSMENT AND PLAN / ED COURSE  I reviewed the triage vital signs and the nursing notes. Patient's presentation is most consistent with acute illness / injury with system symptoms.   Patient with dry cough x 2 days, well-appearing, vital signs unremarkable.  Lungs clear to  auscultation.  Suspect upper respiratory infection, likely viral, possible pneumonia  Chest x-ray is negative for infiltrate or pneumonia, no indication for further workup at this time, recommend supportive care, outpatient follow-up.       FINAL CLINICAL IMPRESSION(S) / ED DIAGNOSES   Final diagnoses:  Acute cough     Rx / DC Orders   ED Discharge Orders          Ordered    benzonatate (TESSALON PERLES) 100 MG capsule  Every 6 hours PRN        03/02/23 1659             Note:  This document was prepared using Dragon voice recognition software and may include unintentional dictation errors.   Jene Every, MD 03/02/23 [phone]

## 2023-06-23 NOTE — Progress Notes (Unsigned)
1:09 PM   Bradley Wolf Feb 05, 1929 [id number]  Referring provider: Lauro Regulus, MD [address] [facility] [place] [address] [place],  [place] [address]  Urological history: 1. BPH with LU TS -finasteride 5 mg daily, tamsulosin 0.4 mg daily and tadalafil 5 mg daily  No chief complaint on file.  HPI: Bradley Wolf is a 89 y.o. male who presents today for yearly follow up with his son, Bradley Wolf.    Previous records reviewed.   I PSS ***      Score:  1-7 Mild 8-19 Moderate 20-35 Severe   PMH: Past Medical History:  Diagnosis Date   BPH (benign prostatic hyperplasia)    CKD (chronic kidney disease) stage 3, GFR 30-59 ml/min (HCC) 03/19/2014   Depression    Elevated PSA    Generalized weakness    Left groin pain    Left hip pain     Surgical History: Past Surgical History:  Procedure Laterality Date   CHOLECYSTECTOMY     cirrhosis     ESOPHAGOGASTRODUODENOSCOPY (EGD) WITH PROPOFOL N/A 01/19/2020   Procedure: ESOPHAGOGASTRODUODENOSCOPY (EGD) WITH PROPOFOL;  Surgeon: Toledo, Boykin Nearing, MD;  Location: [facility] ENDOSCOPY;  Service: Gastroenterology;  Laterality: N/A;   LEG SURGERY  2004   due to accident   this is a knee surgery    Home Medications:  Allergies as of 06/24/2023   No Known Allergies      Medication List        Accurate as of June 23, 2023  1:09 PM. If you have any questions, ask your nurse or doctor.          amitriptyline 10 MG tablet Commonly known as: ELAVIL Take 20 mg by mouth at bedtime.   benzonatate 100 MG capsule Commonly known as: Tessalon Perles Take 1 capsule (100 mg total) by mouth every 6 (six) hours as needed for cough.   cyanocobalamin 1000 MCG tablet Commonly known as: VITAMIN B12 Take by mouth.   finasteride 5 MG tablet Commonly known as: Proscar Take 1 tablet (5 mg total) by mouth daily.   ibuprofen 600 MG tablet Commonly known as: ADVIL Take 1 tablet (600 mg total) by mouth every 6  (six) hours as needed.   pantoprazole 40 MG tablet Commonly known as: PROTONIX Take by mouth.   predniSONE 20 MG tablet Commonly known as: DELTASONE Take 20 mg by mouth 2 (two) times daily.   risperiDONE 0.5 MG tablet Commonly known as: RISPERDAL Take 0.5 mg by mouth 2 (two) times daily.   senna 8.6 MG Tabs tablet Commonly known as: SENOKOT Take 1 tablet by mouth.   tadalafil 5 MG tablet Commonly known as: CIALIS Take 1 tablet (5 mg total) by mouth daily as needed for erectile dysfunction.   tamsulosin 0.4 MG Caps capsule Commonly known as: FLOMAX Take 1 capsule by mouth once daily        Allergies: No Known Allergies  Family History: Family History  Problem Relation Age of Onset   Benign prostatic hyperplasia Brother    Prostate cancer Neg Hx    Kidney disease Neg Hx    Kidney cancer Neg Hx    Bladder Cancer Neg Hx     Social History:  reports that he quit smoking about 59 years ago. His smoking use included cigarettes. He has never used smokeless tobacco. He reports that he does not drink alcohol and does not use drugs.  ROS: For pertinent review of systems  please refer to history of present illness  Physical Exam: There were no vitals taken for this visit.  Constitutional:  Well nourished. Alert and oriented, No acute distress. HEENT:  AT, moist mucus membranes.  Trachea midline, no masses. Cardiovascular: No clubbing, cyanosis, or edema. Respiratory: Normal respiratory effort, no increased work of breathing. GI: Abdomen is soft, non tender, non distended, no abdominal masses. Liver and spleen not palpable.  No hernias appreciated.  Stool sample for occult testing is not indicated.   GU: No CVA tenderness.  No bladder fullness or masses.  Patient with circumcised/uncircumcised phallus. ***Foreskin easily retracted***  Urethral meatus is patent.  No penile discharge. No penile lesions or rashes. Scrotum without lesions, cysts, rashes and/or edema.  Testicles  are located scrotally bilaterally. No masses are appreciated in the testicles. Left and right epididymis are normal. Rectal: Patient with  normal sphincter tone. Anus and perineum without scarring or rashes. No rectal masses are appreciated. Prostate is approximately *** grams, *** nodules are appreciated. Seminal vesicles are normal. Skin: No rashes, bruises or suspicious lesions. Lymph: No cervical or inguinal adenopathy. Neurologic: Grossly intact, no focal deficits, moving all 4 extremities. Psychiatric: Normal mood and affect.   Laboratory Data:  Comprehensive Metabolic Panel (CMP) Order: [id number] Component Ref Range & Units 1 mo ago  Glucose 70 - 110 mg/dL 130  Sodium 865 - 784 mmol/L 138  Potassium 3.6 - 5.1 mmol/L 4.7  Chloride 97 - 109 mmol/L 102  Carbon Dioxide (CO2) 22.0 - 32.0 mmol/L 26.4  Urea Nitrogen (BUN) 7 - 25 mg/dL 17  Creatinine 0.7 - 1.3 mg/dL 1.3  Glomerular Filtration Rate (eGFR) >60 mL/min/1.73sq m 52 Low   Comment: CKD-EPI (2021) does not include patient's race in the calculation of eGFR.  Monitoring changes of plasma creatinine and eGFR over time is useful for monitoring kidney function.  Interpretive Ranges for eGFR (CKD-EPI 2021):  eGFR:       >60 mL/min/1.73 sq. m - Normal eGFR:       30-59 mL/min/1.73 sq. m - Moderately Decreased eGFR:       15-29 mL/min/1.73 sq. m  - Severely Decreased eGFR:       < 15 mL/min/1.73 sq. m  - Kidney Failure   Note: These eGFR calculations do not apply in acute situations when eGFR is changing rapidly or patients on dialysis.  Calcium 8.7 - 10.3 mg/dL 9.8  AST 8 - 39 U/L 20  ALT 6 - 57 U/L 26  Alk Phos (alkaline Phosphatase) 34 - 104 U/L 88  Albumin 3.5 - 4.8 g/dL 4.2  Bilirubin, Total 0.3 - 1.2 mg/dL 0.7  Protein, Total 6.1 - 7.9 g/dL 7.2  A/G Ratio 1.0 - 5.0 gm/dL 1.4  Resulting Agency [facility]   Specimen Collected: 05/12/23 09:24   Performed by: [facility] Last  Resulted: 05/12/23 15:07  Received From: [facility]  Result Received: 05/14/23 13:35   Hemoglobin A1C Order: [id number] Component Ref Range & Units 1 mo ago  Hemoglobin A1C 4.2 - 5.6 % 6.1 High   Average Blood Glucose (Calc) mg/dL 132  Resulting Agency [facility]  Narrative Performed by [facility] Normal Range:    4.2 - 5.6% Increased Risk:  5.7 - 6.4% Diabetes:        >= 6.5% Glycemic Control for adults with diabetes:  <7%    Specimen Collected: 05/12/23 09:24   Performed by: [facility] Resulted: 05/12/23 11:46  Received From: [facility]  Result Received: 05/14/23 13:35  Lipid Panel w/calc LDL Order: [id number] Component Ref Range & Units 1 mo ago  Cholesterol, Total 100 - 200 mg/dL 782  Triglyceride 35 - 199 mg/dL 956  HDL (High Density Lipoprotein) Cholesterol 29.0 - 71.0 mg/dL 21.3  LDL Calculated 0 - 130 mg/dL 86  VLDL Cholesterol mg/dL 39  Cholesterol/HDL Ratio 4.1  Resulting Agency [facility]   Specimen Collected: 05/12/23 09:24   Performed by: [facility] Last Resulted: 05/12/23 15:11  Received From: [facility]  Result Received: 05/14/23 13:35  I have reviewed the labs.  See HPI.     Pertinent imaging CLINICAL DATA:  Abdominal pain, vomiting   EXAM: CT ABDOMEN AND PELVIS WITH CONTRAST   TECHNIQUE: Multidetector CT imaging of the abdomen and pelvis was performed using the standard protocol following bolus administration of intravenous contrast.   RADIATION DOSE REDUCTION: This exam was performed according to the departmental dose-optimization program which includes automated exposure control, adjustment of the mA and/or kV according to patient size and/or use of iterative reconstruction technique.   CONTRAST:  OMNIPAQUE IOHEXOL 300 MG/ML  SOLN   COMPARISON:  11/21/2020   FINDINGS: Lower chest:  Three-vessel coronary artery calcifications. Small hiatal hernia. Mild, bandlike scarring and or partial atelectasis of the bilateral lung bases.   Hepatobiliary: No solid liver abnormality is seen. No gallstones, gallbladder wall thickening, or biliary dilatation.   Pancreas: Unremarkable. No pancreatic ductal dilatation or surrounding inflammatory changes.   Spleen: Normal in size without significant abnormality.   Adrenals/Urinary Tract: Adrenal glands are unremarkable. Kidneys are normal, without renal calculi, solid lesion, or hydronephrosis. Bladder is unremarkable.   Stomach/Bowel: Stomach is within normal limits. Appendix appears normal. No evidence of bowel wall thickening, distention, or inflammatory changes. Gas and fluid-filled, although nondistended loops of small bowel in the central abdomen (series 2, image 43)   Vascular/Lymphatic: Aortic atherosclerosis. No enlarged abdominal or pelvic lymph nodes.   Reproductive: Prostatomegaly.   Other: Small, fat containing right inguinal hernia.  No ascites.   Musculoskeletal: No acute or significant osseous findings.   IMPRESSION: 1. No acute CT findings of the abdomen or pelvis to explain abdominal pain or vomiting. 2. Small hiatal hernia. 3. Prostatomegaly. 4. Coronary artery disease.   Aortic Atherosclerosis (ICD10-I70.0).     Electronically Signed   By: Jearld Lesch M.D.   On: 05/09/2022 14:35  I have independently reviewed the films.  See HPI.     Assessment & Plan:    1. BPH with LUTS -aged out of screening -at goal w/ medications  -continue tamsulosin 0.4 mg daily, tadalafil 5 mg and finasteride 5 mg daily    No follow-ups on file.  Cloretta Ned  [facility] [address] [place], [place] [address] [phone]

## 2023-06-24 ENCOUNTER — Encounter: Payer: Self-pay | Admitting: Urology

## 2023-06-24 ENCOUNTER — Ambulatory Visit (INDEPENDENT_AMBULATORY_CARE_PROVIDER_SITE_OTHER): Payer: Medicare Other | Admitting: Urology

## 2023-06-24 VITALS — BP 140/59 | HR 86 | Ht 62.0 in | Wt 147.0 lb

## 2023-06-24 DIAGNOSIS — N401 Enlarged prostate with lower urinary tract symptoms: Secondary | ICD-10-CM | POA: Diagnosis not present

## 2023-06-24 DIAGNOSIS — N138 Other obstructive and reflux uropathy: Secondary | ICD-10-CM

## 2023-06-24 MED ORDER — TAMSULOSIN HCL 0.4 MG PO CAPS
0.4000 mg | ORAL_CAPSULE | Freq: Every day | ORAL | 3 refills | Status: DC
Start: 2023-06-24 — End: 2024-08-12

## 2023-06-24 MED ORDER — TADALAFIL 5 MG PO TABS
5.0000 mg | ORAL_TABLET | Freq: Every day | ORAL | 3 refills | Status: DC | PRN
Start: 2023-06-24 — End: 2024-01-22

## 2023-06-24 MED ORDER — FINASTERIDE 5 MG PO TABS
5.0000 mg | ORAL_TABLET | Freq: Every day | ORAL | 3 refills | Status: DC
Start: 2023-06-24 — End: 2023-07-21

## 2023-07-21 ENCOUNTER — Other Ambulatory Visit: Payer: Self-pay | Admitting: Urology

## 2023-07-21 DIAGNOSIS — N138 Other obstructive and reflux uropathy: Secondary | ICD-10-CM

## 2023-11-05 ENCOUNTER — Other Ambulatory Visit: Payer: Self-pay | Admitting: Urology

## 2023-11-05 DIAGNOSIS — N138 Other obstructive and reflux uropathy: Secondary | ICD-10-CM

## 2023-12-22 ENCOUNTER — Encounter: Payer: Self-pay | Admitting: Ophthalmology

## 2023-12-22 NOTE — Anesthesia Preprocedure Evaluation (Addendum)
 Anesthesia Evaluation  Patient identified by MRN, date of birth, ID band Patient awake    Reviewed: Allergy & Precautions, H&P , NPO status , Patient's Chart, lab work & pertinent test results  Airway Mallampati: IV  TM Distance: >3 FB Neck ROM: Limited  Mouth opening: Limited Mouth Opening  Dental no notable dental hx. (+) Upper Dentures, Lower Dentures   Pulmonary former smoker Office note 12-03-23  recently underwent a pulmonary function test, which showed an oxygen saturation of 98%, an FEV1 at 110%, and lung volumes at 67% DLCO he could not do well and was not intepreted. No current cough is reported.   Pulmonary exam normal breath sounds clear to auscultation       Cardiovascular hypertension, Normal cardiovascular exam Rhythm:Regular Rate:Normal     Neuro/Psych  PSYCHIATRIC DISORDERS  Depression    Patient has a hiatal hernia, and for some reason, Epic thinks this is a "neurological disease."  negative neurological ROS     GI/Hepatic Neg liver ROS, hiatal hernia, PUD,,,  Endo/Other  negative endocrine ROS    Renal/GU Renal disease  negative genitourinary   Musculoskeletal negative musculoskeletal ROS (+)    Abdominal   Peds negative pediatric ROS (+)  Hematology negative hematology ROS (+)   Anesthesia Other Findings Left hip pain  BPH (benign prostatic hyperplasia) Elevated PSA  Generalized weakness Left groin pain  Depression CKD (chronic kidney disease) stage 3, GFR 30-59 ml/min  Wears hearing aid in both ears Wears dentures  HTN (hypertension) Gastric ulcer  Chronic cough Hiatal hernia  Reflux esophagitis PUD (peptic ulcer disease)  SOB (shortness of breath) Spanish-speaking only, translator used    Reproductive/Obstetrics negative OB ROS                              Anesthesia Physical Anesthesia Plan  ASA: 2  Anesthesia Plan: MAC   Post-op Pain Management:     Induction: Intravenous  PONV Risk Score and Plan:   Airway Management Planned: Natural Airway and Nasal Cannula  Additional Equipment:   Intra-op Plan:   Post-operative Plan:   Informed Consent: I have reviewed the patients History and Physical, chart, labs and discussed the procedure including the risks, benefits and alternatives for the proposed anesthesia with the patient or authorized representative who has indicated his/her understanding and acceptance.     Dental Advisory Given  Plan Discussed with: Anesthesiologist, CRNA and Surgeon  Anesthesia Plan Comments: (Patient consented for risks of anesthesia including but not limited to:  - adverse reactions to medications - damage to eyes, teeth, lips or other oral mucosa - nerve damage due to positioning  - sore throat or hoarseness - Damage to heart, brain, nerves, lungs, other parts of body or loss of life  Patient voiced understanding and assent.)        Anesthesia Quick Evaluation

## 2023-12-29 ENCOUNTER — Encounter
Admission: RE | Disposition: A | Discharge status: Home / Self Care | Payer: Self-pay | Source: Home / Self Care | Attending: Ophthalmology

## 2023-12-29 ENCOUNTER — Encounter: Payer: Self-pay | Admitting: Ophthalmology

## 2023-12-29 ENCOUNTER — Ambulatory Visit: Payer: Self-pay | Admitting: Anesthesiology

## 2023-12-29 ENCOUNTER — Other Ambulatory Visit: Payer: Self-pay

## 2023-12-29 ENCOUNTER — Ambulatory Visit
Admission: RE | Admit: 2023-12-29 | Discharge: 2023-12-29 | Disposition: A | Discharge status: Home / Self Care | Attending: Ophthalmology | Admitting: Ophthalmology

## 2023-12-29 DIAGNOSIS — Z87891 Personal history of nicotine dependence: Secondary | ICD-10-CM | POA: Insufficient documentation

## 2023-12-29 DIAGNOSIS — H2512 Age-related nuclear cataract, left eye: Secondary | ICD-10-CM | POA: Insufficient documentation

## 2023-12-29 DIAGNOSIS — N183 Chronic kidney disease, stage 3 unspecified: Secondary | ICD-10-CM | POA: Diagnosis not present

## 2023-12-29 DIAGNOSIS — I129 Hypertensive chronic kidney disease with stage 1 through stage 4 chronic kidney disease, or unspecified chronic kidney disease: Secondary | ICD-10-CM | POA: Diagnosis not present

## 2023-12-29 HISTORY — DX: Gastric ulcer, unspecified as acute or chronic, without hemorrhage or perforation: K25.9

## 2023-12-29 HISTORY — DX: Presence of dental prosthetic device (complete) (partial): Z97.2

## 2023-12-29 HISTORY — DX: Presence of external hearing-aid: Z97.4

## 2023-12-29 HISTORY — DX: Shortness of breath: R06.02

## 2023-12-29 HISTORY — DX: Diaphragmatic hernia without obstruction or gangrene: K44.9

## 2023-12-29 HISTORY — DX: Gastro-esophageal reflux disease with esophagitis, without bleeding: K21.00

## 2023-12-29 HISTORY — DX: Peptic ulcer, site unspecified, unspecified as acute or chronic, without hemorrhage or perforation: K27.9

## 2023-12-29 HISTORY — DX: Chronic cough: R05.3

## 2023-12-29 HISTORY — PX: CATARACT EXTRACTION W/PHACO: SHX586

## 2023-12-29 SURGERY — PHACOEMULSIFICATION, CATARACT, WITH IOL INSERTION
Anesthesia: Monitor Anesthesia Care | Site: Eye | Laterality: Left

## 2023-12-29 MED ORDER — LIDOCAINE HCL (PF) 2 % IJ SOLN
INTRAOCULAR | Status: DC | PRN
  Administered 2023-12-29: 4 mL via INTRAOCULAR

## 2023-12-29 MED ORDER — TETRACAINE HCL 0.5 % OP SOLN
1.0000 [drp] | OPHTHALMIC | Status: DC | PRN
  Administered 2023-12-29 (×3): 1 [drp] via OPHTHALMIC

## 2023-12-29 MED ORDER — SIGHTPATH DOSE#1 BSS IO SOLN
INTRAOCULAR | Status: DC | PRN
  Administered 2023-12-29: 125 mL via OPHTHALMIC

## 2023-12-29 MED ORDER — MOXIFLOXACIN HCL 0.5 % OP SOLN
OPHTHALMIC | Status: DC | PRN
  Administered 2023-12-29: .2 mL via OPHTHALMIC

## 2023-12-29 MED ORDER — SIGHTPATH DOSE#1 NA HYALUR & NA CHOND-NA HYALUR IO KIT
PACK | INTRAOCULAR | Status: DC | PRN
  Administered 2023-12-29: 1 via OPHTHALMIC

## 2023-12-29 MED ORDER — MIDAZOLAM HCL 2 MG/2ML IJ SOLN
INTRAMUSCULAR | Status: DC | PRN
  Administered 2023-12-29: .5 mg via INTRAVENOUS

## 2023-12-29 MED ORDER — FENTANYL CITRATE (PF) 100 MCG/2ML IJ SOLN
INTRAMUSCULAR | Status: AC
  Filled 2023-12-29: qty 2

## 2023-12-29 MED ORDER — ARMC OPHTHALMIC DILATING DROPS
OPHTHALMIC | Status: AC
  Filled 2023-12-29: qty 0.5

## 2023-12-29 MED ORDER — SIGHTPATH DOSE#1 BSS IO SOLN
INTRAOCULAR | Status: DC | PRN
  Administered 2023-12-29: 15 mL via INTRAOCULAR

## 2023-12-29 MED ORDER — TETRACAINE HCL 0.5 % OP SOLN
OPHTHALMIC | Status: AC
  Filled 2023-12-29: qty 4

## 2023-12-29 MED ORDER — MIDAZOLAM HCL 2 MG/2ML IJ SOLN
INTRAMUSCULAR | Status: AC
  Filled 2023-12-29: qty 2

## 2023-12-29 MED ORDER — FENTANYL CITRATE (PF) 100 MCG/2ML IJ SOLN
INTRAMUSCULAR | Status: DC | PRN
  Administered 2023-12-29: 50 ug via INTRAVENOUS

## 2023-12-29 MED ORDER — ARMC OPHTHALMIC DILATING DROPS
1.0000 | OPHTHALMIC | Status: DC | PRN
  Administered 2023-12-29 (×3): 1 via OPHTHALMIC

## 2023-12-29 SURGICAL SUPPLY — 12 items
CATARACT SUITE SIGHTPATH (MISCELLANEOUS) ×1 IMPLANT
DISSECTOR HYDRO NUCLEUS 50X22 (MISCELLANEOUS) ×1 IMPLANT
FEE CATARACT SUITE SIGHTPATH (MISCELLANEOUS) ×1 IMPLANT
GLOVE PI ULTRA LF STRL 7.5 (GLOVE) ×1 IMPLANT
GLOVE SURG POLYISOPRENE 8.5 (GLOVE) ×1 IMPLANT
GLOVE SURG PROTEXIS BL SZ6.5 (GLOVE) ×1 IMPLANT
GLOVE SURG SYN 6.5 PF PI BL (GLOVE) ×1 IMPLANT
GLOVE SURG SYN 8.5 PF PI BL (GLOVE) ×1 IMPLANT
LENS IOL TECNIS EYHANCE 23.5 (Intraocular Lens) IMPLANT
NDL FILTER BLUNT 18X1 1/2 (NEEDLE) ×1 IMPLANT
NEEDLE FILTER BLUNT 18X1 1/2 (NEEDLE) ×1 IMPLANT
SYR 3ML LL SCALE MARK (SYRINGE) ×1 IMPLANT

## 2023-12-29 NOTE — Discharge Instructions (Signed)
 El cuidado despu?s de una operaci?n de cataratas ?Cataract Surgery, Care After (Spanish) ? ?Esta hoja le da informaci?n sobre c?mo cuidarse despu?s de su cirug?a. Su oftalm?logo puede darle instrucciones m?s espec?ficas tambi?n. Si tiene problemas o preguntas, comun?quese con su doctor en [facility], [phone]. ? ??Qu? puedo esperar despu?s de la cirug?a? ?Es normal tener: ?Picaz?n ?Sensaci?n de tener un cuerpo extra?o (se siente como un grano de Investment banker, corporate ojo) ?Secreci?n acuosa (lagrimeo excesivo) ?Sensibilidad a la luz y al tacto ?Moretones en el ojo o a su alrededor ?Visi?n ligeramente borrosa ? ?Siga estas instrucciones en casa: ?No se toque ni se frote los ojos. ?Es posible que le digan que use una pantalla protectora o lentes de sol para proteger sus ojos. ?No se ponga un lente de contacto en el ojo operado hasta que su doctor lo apruebe. ?Mantenga los p?rpados y la cara limpios y secos. ?No deje que el agua le caiga directamente en la cara mientras se duche. ?Evite el jab?n y champ? en los ojos. ?No se maquille los ojos por [employer]. ? ?Rev?sese su ojo todos los d?as para ver si hay  signos de infecci?n. Est? atento(a): ?Enrojecimiento, hinchaz?n o dolor. ?L?quido, sangre o pus. ?Empeoramiento de la visi?n. ?Aumento de la sensibilidad a la luz o al tacto. ? ?Actividad: ?Physicist, medical d?a, evite agacharse y leer. Puede volver a leer y a agacharse al d?a siguiente. ?No maneje ni use maquinaria pesada durante al menos 24 horas. ?Evite las actividades vigorosas durante una semana. Est? bien Soil scientist, usar la cinta de correr, usar la bicicleta est?tica y [employer]. ?No levante objetos pesados (m?s de 20 libras) por una semana. ?No realice trabajos de jardiner?a ni tareas dom?sticas sucias en la casa (como limpiar los pisos, los ba?os, pasar la aspiradora, etc.) durante una semana. ?No nade ni use un jacuzzi durante 2 semanas.  ?Pregunte a su doctor cu?ndo  usted puede volver a trabajar. ? ?Instrucciones generales: ?Tome o aplique los medicamentos recetados y de venta libre seg?n le indique su doctor, incluyendo las gotas para los ojos y las pomadas. ?Contin?e tomando los medicamentos que fueron suspendidos antes de la cirug?a, a menos que su doctor le indique lo contrario. ?Vaya a todas sus citas de seguimiento que ya fueron programadas. ? ? ?P?ngase en contacto con un proveedor de atenci?n m?dica si: ?Le aparecen m?s moretones alrededor del ojo. ?Tiene dolor que no se Systems analyst. ?Tiene fiebre. ?Le sale l?quido, pus o sangre del ojo o de la incisi?n. ?Su sensibilidad a la Actor. ?Tiene manchas (moscas volantes) o destellos de luz en la vista. ?Tiene n?useas o v?mitos. ? ?Vaya al [facility]art[facility]gencias m?s cercana o llame al 911 si: ?Pierde la vista de forma repentina. ?Tiene un dolor de ojo que es intenso y que se est? empeorando. ?

## 2023-12-29 NOTE — H&P (Signed)
 [facility]   Primary Care Physician:  Lauro Regulus, MD Ophthalmologist: Dr. Willey Blade  Pre-Procedure History & Physical: HPI:  Bradley Wolf is a 89 y.o. male here for cataract surgery.   Past Medical History:  Diagnosis Date   BPH (benign prostatic hyperplasia)    Chronic cough    CKD (chronic kidney disease) stage 3, GFR 30-59 ml/min (HCC) 03/19/2014   Depression    Elevated PSA    Gastric ulcer    Generalized weakness    Hiatal hernia    HTN (hypertension)    Left groin pain    Left hip pain    PUD (peptic ulcer disease)    Reflux esophagitis    SOB (shortness of breath)    Wears dentures    full upper and lower   Wears hearing aid in both ears     Past Surgical History:  Procedure Laterality Date   CHOLECYSTECTOMY     cirrhosis     ESOPHAGOGASTRODUODENOSCOPY (EGD) WITH PROPOFOL N/A 01/19/2020   Procedure: ESOPHAGOGASTRODUODENOSCOPY (EGD) WITH PROPOFOL;  Surgeon: Toledo, Boykin Nearing, MD;  Location: [facility] ENDOSCOPY;  Service: Gastroenterology;  Laterality: N/A;   LEG SURGERY  2004   due to accident   this is a knee surgery    Prior to Admission medications   Medication Sig Start Date End Date Taking? Authorizing Provider  amitriptyline (ELAVIL) 10 MG tablet Take 20 mg by mouth at bedtime.   Yes [provider]  cyanocobalamin (VITAMIN B12) 1000 MCG tablet Take by mouth.   Yes [provider]  finasteride (PROSCAR) 5 MG tablet Take 1 tablet by mouth once daily 11/05/23  Yes McGowan, Carollee Herter A, PA-C  gabapentin (NEURONTIN) 300 MG capsule Take 300 mg by mouth.   Yes [provider]  ibuprofen (ADVIL) 600 MG tablet Take 1 tablet (600 mg total) by mouth every 6 (six) hours as needed. 06/28/22  Yes Lynden Oxford R, PA-C  Ipratropium-Albuterol (COMBIVENT RESPIMAT) 20-100 MCG/ACT AERS respimat Inhale 1 puff into the lungs every 6 (six) hours.   Yes [provider]  Omega-3 Fatty Acids (FISH OIL) 1000 MG CAPS Take by  mouth daily.   Yes [provider]  pantoprazole (PROTONIX) 40 MG tablet Take by mouth. 04/18/16 12/22/23 Yes [provider]  risperiDONE (RISPERDAL) 0.5 MG tablet Take 0.5 mg by mouth 2 (two) times daily.   Yes [provider]  senna (SENOKOT) 8.6 MG TABS tablet Take 1 tablet by mouth.   Yes [provider]  tamsulosin (FLOMAX) 0.4 MG CAPS capsule Take 1 capsule (0.4 mg total) by mouth daily. 06/24/23  Yes McGowan, Carollee Herter A, PA-C  tadalafil (CIALIS) 5 MG tablet Take 1 tablet (5 mg total) by mouth daily as needed for erectile dysfunction. Patient not taking: Reported on 12/22/2023 06/24/23   Michiel Cowboy A, PA-C    Allergies as of 12/15/2023   (No Known Allergies)    Family History  Problem Relation Age of Onset   Benign prostatic hyperplasia Brother    Prostate cancer Neg Hx    Kidney disease Neg Hx    Kidney cancer Neg Hx    Bladder Cancer Neg Hx     Social History   Socioeconomic History   Marital status: Widowed    Spouse name: Not on file   Number of children: Not on file   Years of education: Not on file   Highest education level: Not on file  Occupational History   Not  on file  Tobacco Use   Smoking status: Former    Current packs/day: 0.00    Types: Cigarettes    Quit date: 38    Years since quitting: 60.2   Smokeless tobacco: Never  Vaping Use   Vaping status: Never Used  Substance and Sexual Activity   Alcohol use: No    Alcohol/week: 0.0 standard drinks of alcohol   Drug use: No   Sexual activity: Not on file  Other Topics Concern   Not on file  Social History Narrative   Not on file   Social Drivers of Health   Financial Resource Strain: Low Risk  (11/13/2023)   Received from [facility]   Overall Financial Resource Strain (CARDIA)    Difficulty of Paying Living Expenses: Not hard at all  Food Insecurity: No Food Insecurity (11/13/2023)   Received from [facility]   Hunger  Vital Sign    Worried About Running Out of Food in the Last Year: Never true    Ran Out of Food in the Last Year: Never true  Transportation Needs: No Transportation Needs (11/13/2023)   Received from [facility] - Transportation    In the past 12 months, has lack of transportation kept you from medical appointments or from getting medications?: No    Lack of Transportation (Non-Medical): No  Physical Activity: Not on file  Stress: Not on file  Social Connections: Not on file  Intimate Partner Violence: Not At Risk (06/26/2022)   Humiliation, Afraid, Rape, and Kick questionnaire    Fear of Current or Ex-Partner: No    Emotionally Abused: No    Physically Abused: No    Sexually Abused: No    Review of Systems: See HPI, otherwise negative ROS  Physical Exam: BP (!) 138/56   Pulse 69   Temp 97.9 F (36.6 C) (Temporal)   Resp 13   Ht 5' 2.99" (1.6 m)   Wt 70.3 kg   SpO2 95%   BMI 27.46 kg/m  General:   Alert, cooperative in NAD Head:  Normocephalic and atraumatic. Respiratory:  Normal work of breathing. Cardiovascular:  RRR  Impression/Plan: Bradley Wolf is here for cataract surgery.  Risks, benefits, limitations, and alternatives regarding cataract surgery have been reviewed with the patient.  Questions have been answered.  All parties agreeable.   Willey Blade, MD  12/29/2023, 9:09 AM

## 2023-12-29 NOTE — Anesthesia Postprocedure Evaluation (Signed)
 Anesthesia Post Note  Patient: Bradley Wolf  Procedure(s) Performed: PHACOEMULSIFICATION, CATARACT, WITH IOL INSERTION 34.16 02:36.7 (Left: Eye)  Patient location during evaluation: PACU Anesthesia Type: MAC Level of consciousness: awake and alert Pain management: pain level controlled Vital Signs Assessment: post-procedure vital signs reviewed and stable Respiratory status: spontaneous breathing, nonlabored ventilation, respiratory function stable and patient connected to nasal cannula oxygen Cardiovascular status: stable and blood pressure returned to baseline Postop Assessment: no apparent nausea or vomiting Anesthetic complications: no   No notable events documented.   Last Vitals:  Vitals:   12/29/23 0941 12/29/23 0945  BP: 125/60 133/67  Pulse: 60 63  Resp: 15 12  Temp: (!) 36.3 C (!) 36.3 C  SpO2:  95%    Last Pain:  Vitals:   12/29/23 0941  TempSrc:   PainSc: 0-No pain                 Marisue Humble

## 2023-12-29 NOTE — Op Note (Signed)
 OPERATIVE NOTE  Joah Patlan [id number] 12/29/2023   PREOPERATIVE DIAGNOSIS:  Nuclear sclerotic cataract left eye.  H25.12   POSTOPERATIVE DIAGNOSIS:    Nuclear sclerotic cataract left eye.     PROCEDURE:  Phacoemusification with posterior chamber intraocular lens placement of the left eye   LENS:   Implant Name Type Inv. Item Serial No. Manufacturer Lot No. LRB No. Used Action  LENS IOL TECNIS EYHANCE 23.5 - [id number] Intraocular Lens LENS IOL TECNIS EYHANCE 23.5 [id number] SIGHTPATH  Left 1 Implanted      Procedure(s): PHACOEMULSIFICATION, CATARACT, WITH IOL INSERTION 34.16 02:36.7 (Left)  SURGEON:  Willey Blade, MD, MPH   ANESTHESIA:  Topical with tetracaine drops augmented with 1% preservative-free intracameral lidocaine.  ESTIMATED BLOOD LOSS: <1 mL   COMPLICATIONS:  None.   DESCRIPTION OF PROCEDURE:  The patient was identified in the holding room and transported to the operating room and placed in the supine position under the operating microscope.  The left eye was identified as the operative eye and it was prepped and draped in the usual sterile ophthalmic fashion.   A 1.0 millimeter clear-corneal paracentesis was made at the 5:00 position. 0.5 ml of preservative-free 1% lidocaine with epinephrine was injected into the anterior chamber.  The anterior chamber was filled with viscoelastic.  A 2.4 millimeter keratome was used to make a near-clear corneal incision at the 2:00 position.  A curvilinear capsulorrhexis was made with a cystotome and capsulorrhexis forceps.  Balanced salt solution was used to hydrodissect and hydrodelineate the nucleus.   Phacoemulsification was then used in stop and chop fashion to remove the lens nucleus and epinucleus.  The remaining cortex was then removed using the irrigation and aspiration handpiece. Viscoelastic was then placed into the capsular bag to distend it for lens placement.  A lens was then injected into the capsular bag.  The  remaining viscoelastic was aspirated.   Wounds were hydrated with balanced salt solution.  The anterior chamber was inflated to a physiologic pressure with balanced salt solution.  The lens was very dense and required significant phaco energy despite many chopping maneuvers.  The patient had poor fixation and did not respond well to direction.   Intracameral vigamox 0.1 mL undiltued was injected into the eye and a drop placed onto the ocular surface.  No wound leaks were noted.  The patient was taken to the recovery room in stable condition without complications of anesthesia or surgery  Willey Blade 12/29/2023, 9:42 AM

## 2023-12-29 NOTE — Transfer of Care (Signed)
 Immediate Anesthesia Transfer of Care Note  Patient: Bradley Wolf  Procedure(s) Performed: PHACOEMULSIFICATION, CATARACT, WITH IOL INSERTION 34.16 02:36.7 (Left: Eye)  Patient Location: PACU  Anesthesia Type: MAC  Level of Consciousness: awake, alert  and patient cooperative  Airway and Oxygen Therapy: Patient Spontanous Breathing and Patient connected to supplemental oxygen  Post-op Assessment: Post-op Vital signs reviewed, Patient's Cardiovascular Status Stable, Respiratory Function Stable, Patent Airway and No signs of Nausea or vomiting  Post-op Vital Signs: Reviewed and stable  Complications: No notable events documented.

## 2024-01-05 NOTE — Anesthesia Preprocedure Evaluation (Addendum)
 Anesthesia Evaluation  Patient identified by MRN, date of birth, ID band Patient awake    Reviewed: Allergy & Precautions, H&P , NPO status , Patient's Chart, lab work & pertinent test results  Airway Mallampati: IV  TM Distance: >3 FB Neck ROM: Full    Dental no notable dental hx. (+) Lower Dentures, Upper Dentures   Pulmonary neg pulmonary ROS, former smoker Office note 12-03-23  recently underwent a pulmonary function test, which showed an oxygen saturation of 98%, an FEV1 at 110%, and lung volumes at 67% DLCO he could not do well and was not intepreted. No current cough is reported.   Pulmonary exam normal breath sounds clear to auscultation       Cardiovascular hypertension, negative cardio ROS Normal cardiovascular exam Rhythm:Regular Rate:Normal     Neuro/Psych  PSYCHIATRIC DISORDERS  Depression     Neuromuscular disease negative neurological ROS  negative psych ROS   GI/Hepatic negative GI ROS, Neg liver ROS, hiatal hernia, PUD,,,  Endo/Other  negative endocrine ROS    Renal/GU Renal diseasenegative Renal ROS  negative genitourinary   Musculoskeletal negative musculoskeletal ROS (+)    Abdominal   Peds negative pediatric ROS (+)  Hematology negative hematology ROS (+)   Anesthesia Other Findings Previous cataract surgery 12-29-23   Left hip pain  BPH (benign prostatic hyperplasia) Elevated PSA  Generalized weakness Left groin pain  Depression CKD (chronic kidney disease) stage 3, GFR 30-59 ml/min (HCC)  Wears hearing aid in both ears Wears dentures  HTN (hypertension) Gastric ulcer  Chronic cough Hiatal hernia  Reflux esophagitis PUD (peptic ulcer disease)  SOB (shortness of breath)    Reproductive/Obstetrics negative OB ROS                              Anesthesia Physical Anesthesia Plan  ASA: 2  Anesthesia Plan: MAC   Post-op Pain Management:    Induction:  Intravenous  PONV Risk Score and Plan:   Airway Management Planned: Natural Airway and Nasal Cannula  Additional Equipment:   Intra-op Plan:   Post-operative Plan:   Informed Consent: I have reviewed the patients History and Physical, chart, labs and discussed the procedure including the risks, benefits and alternatives for the proposed anesthesia with the patient or authorized representative who has indicated his/her understanding and acceptance.     Dental Advisory Given  Plan Discussed with: Anesthesiologist, CRNA and Surgeon  Anesthesia Plan Comments: (Patient consented for risks of anesthesia including but not limited to:  - adverse reactions to medications - damage to eyes, teeth, lips or other oral mucosa - nerve damage due to positioning  - sore throat or hoarseness - Damage to heart, brain, nerves, lungs, other parts of body or loss of life  Patient voiced understanding and assent.)         Anesthesia Quick Evaluation

## 2024-01-08 NOTE — Discharge Instructions (Signed)
 El cuidado despu?s de una operaci?n de cataratas ?Cataract Surgery, Care After (Spanish) ? ?Esta hoja le da informaci?n sobre c?mo cuidarse despu?s de su cirug?a. Su oftalm?logo puede darle instrucciones m?s espec?ficas tambi?n. Si tiene problemas o preguntas, comun?quese con su doctor en [facility], [phone]. ? ??Qu? puedo esperar despu?s de la cirug?a? ?Es normal tener: ?Picaz?n ?Sensaci?n de tener un cuerpo extra?o (se siente como un grano de Investment banker, corporate ojo) ?Secreci?n acuosa (lagrimeo excesivo) ?Sensibilidad a la luz y al tacto ?Moretones en el ojo o a su alrededor ?Visi?n ligeramente borrosa ? ?Siga estas instrucciones en casa: ?No se toque ni se frote los ojos. ?Es posible que le digan que use una pantalla protectora o lentes de sol para proteger sus ojos. ?No se ponga un lente de contacto en el ojo operado hasta que su doctor lo apruebe. ?Mantenga los p?rpados y la cara limpios y secos. ?No deje que el agua le caiga directamente en la cara mientras se duche. ?Evite el jab?n y champ? en los ojos. ?No se maquille los ojos por [employer]. ? ?Rev?sese su ojo todos los d?as para ver si hay  signos de infecci?n. Est? atento(a): ?Enrojecimiento, hinchaz?n o dolor. ?L?quido, sangre o pus. ?Empeoramiento de la visi?n. ?Aumento de la sensibilidad a la luz o al tacto. ? ?Actividad: ?Physicist, medical d?a, evite agacharse y leer. Puede volver a leer y a agacharse al d?a siguiente. ?No maneje ni use maquinaria pesada durante al menos 24 horas. ?Evite las actividades vigorosas durante una semana. Est? bien Soil scientist, usar la cinta de correr, usar la bicicleta est?tica y [employer]. ?No levante objetos pesados (m?s de 20 libras) por una semana. ?No realice trabajos de jardiner?a ni tareas dom?sticas sucias en la casa (como limpiar los pisos, los ba?os, pasar la aspiradora, etc.) durante una semana. ?No nade ni use un jacuzzi durante 2 semanas.  ?Pregunte a su doctor cu?ndo  usted puede volver a trabajar. ? ?Instrucciones generales: ?Tome o aplique los medicamentos recetados y de venta libre seg?n le indique su doctor, incluyendo las gotas para los ojos y las pomadas. ?Contin?e tomando los medicamentos que fueron suspendidos antes de la cirug?a, a menos que su doctor le indique lo contrario. ?Vaya a todas sus citas de seguimiento que ya fueron programadas. ? ? ?P?ngase en contacto con un proveedor de atenci?n m?dica si: ?Le aparecen m?s moretones alrededor del ojo. ?Tiene dolor que no se Systems analyst. ?Tiene fiebre. ?Le sale l?quido, pus o sangre del ojo o de la incisi?n. ?Su sensibilidad a la Actor. ?Tiene manchas (moscas volantes) o destellos de luz en la vista. ?Tiene n?useas o v?mitos. ? ?Vaya al [facility]art[facility]gencias m?s cercana o llame al 911 si: ?Pierde la vista de forma repentina. ?Tiene un dolor de ojo que es intenso y que se est? empeorando. ?

## 2024-01-12 ENCOUNTER — Encounter: Payer: Self-pay | Admitting: Ophthalmology

## 2024-01-12 ENCOUNTER — Ambulatory Visit
Admission: RE | Admit: 2024-01-12 | Discharge: 2024-01-12 | Disposition: A | Discharge status: Home / Self Care | Attending: Ophthalmology | Admitting: Ophthalmology

## 2024-01-12 ENCOUNTER — Encounter
Admission: RE | Disposition: A | Discharge status: Home / Self Care | Payer: Self-pay | Source: Home / Self Care | Attending: Ophthalmology

## 2024-01-12 ENCOUNTER — Other Ambulatory Visit: Payer: Self-pay

## 2024-01-12 ENCOUNTER — Ambulatory Visit: Payer: Self-pay | Admitting: Anesthesiology

## 2024-01-12 DIAGNOSIS — N183 Chronic kidney disease, stage 3 unspecified: Secondary | ICD-10-CM | POA: Diagnosis not present

## 2024-01-12 DIAGNOSIS — Z87891 Personal history of nicotine dependence: Secondary | ICD-10-CM | POA: Diagnosis not present

## 2024-01-12 DIAGNOSIS — I129 Hypertensive chronic kidney disease with stage 1 through stage 4 chronic kidney disease, or unspecified chronic kidney disease: Secondary | ICD-10-CM | POA: Diagnosis not present

## 2024-01-12 DIAGNOSIS — K449 Diaphragmatic hernia without obstruction or gangrene: Secondary | ICD-10-CM | POA: Diagnosis not present

## 2024-01-12 DIAGNOSIS — H2511 Age-related nuclear cataract, right eye: Secondary | ICD-10-CM | POA: Diagnosis present

## 2024-01-12 DIAGNOSIS — F32A Depression, unspecified: Secondary | ICD-10-CM | POA: Insufficient documentation

## 2024-01-12 HISTORY — PX: CATARACT EXTRACTION W/PHACO: SHX586

## 2024-01-12 SURGERY — PHACOEMULSIFICATION, CATARACT, WITH IOL INSERTION
Anesthesia: Monitor Anesthesia Care | Site: Eye | Laterality: Right

## 2024-01-12 MED ORDER — LIDOCAINE HCL (PF) 2 % IJ SOLN
INTRAOCULAR | Status: DC | PRN
  Administered 2024-01-12: 4 mL via INTRAOCULAR

## 2024-01-12 MED ORDER — SIGHTPATH DOSE#1 BSS IO SOLN
INTRAOCULAR | Status: DC | PRN
  Administered 2024-01-12: 133 mL via OPHTHALMIC

## 2024-01-12 MED ORDER — TETRACAINE HCL 0.5 % OP SOLN
OPHTHALMIC | Status: AC
  Filled 2024-01-12: qty 4

## 2024-01-12 MED ORDER — SIGHTPATH DOSE#1 NA HYALUR & NA CHOND-NA HYALUR IO KIT
PACK | INTRAOCULAR | Status: DC | PRN
  Administered 2024-01-12: 1 via OPHTHALMIC

## 2024-01-12 MED ORDER — ARMC OPHTHALMIC DILATING DROPS
1.0000 | OPHTHALMIC | Status: DC | PRN
  Administered 2024-01-12 (×3): 1 via OPHTHALMIC

## 2024-01-12 MED ORDER — ARMC OPHTHALMIC DILATING DROPS
OPHTHALMIC | Status: AC
  Filled 2024-01-12: qty 0.5

## 2024-01-12 MED ORDER — MOXIFLOXACIN HCL 0.5 % OP SOLN
OPHTHALMIC | Status: DC | PRN
  Administered 2024-01-12: .2 mL via OPHTHALMIC

## 2024-01-12 MED ORDER — FENTANYL CITRATE (PF) 100 MCG/2ML IJ SOLN
INTRAMUSCULAR | Status: DC | PRN
Start: 2024-01-12 — End: 2024-01-12
  Administered 2024-01-12: 50 ug via INTRAVENOUS

## 2024-01-12 MED ORDER — SIGHTPATH DOSE#1 BSS IO SOLN
INTRAOCULAR | Status: DC | PRN
Start: 2024-01-12 — End: 2024-01-12
  Administered 2024-01-12: 15 mL via INTRAOCULAR

## 2024-01-12 MED ORDER — TETRACAINE HCL 0.5 % OP SOLN
1.0000 [drp] | OPHTHALMIC | Status: DC | PRN
  Administered 2024-01-12 (×3): 1 [drp] via OPHTHALMIC

## 2024-01-12 MED ORDER — FENTANYL CITRATE (PF) 100 MCG/2ML IJ SOLN
INTRAMUSCULAR | Status: AC
  Filled 2024-01-12: qty 2

## 2024-01-12 SURGICAL SUPPLY — 12 items
CATARACT SUITE SIGHTPATH (MISCELLANEOUS) ×1 IMPLANT
DISSECTOR HYDRO NUCLEUS 50X22 (MISCELLANEOUS) ×1 IMPLANT
FEE CATARACT SUITE SIGHTPATH (MISCELLANEOUS) ×1 IMPLANT
GLOVE PI ULTRA LF STRL 7.5 (GLOVE) ×1 IMPLANT
GLOVE SURG POLYISOPRENE 8.5 (GLOVE) ×1 IMPLANT
GLOVE SURG PROTEXIS BL SZ6.5 (GLOVE) ×1 IMPLANT
GLOVE SURG SYN 6.5 PF PI BL (GLOVE) ×1 IMPLANT
GLOVE SURG SYN 8.5 PF PI BL (GLOVE) ×1 IMPLANT
LENS IOL TECNIS EYHANCE 23.0 (Intraocular Lens) IMPLANT
NDL FILTER BLUNT 18X1 1/2 (NEEDLE) ×1 IMPLANT
NEEDLE FILTER BLUNT 18X1 1/2 (NEEDLE) ×1 IMPLANT
SYR 3ML LL SCALE MARK (SYRINGE) ×1 IMPLANT

## 2024-01-12 NOTE — Op Note (Signed)
 OPERATIVE NOTE  Bradley Wolf [id number] 01/12/2024   PREOPERATIVE DIAGNOSIS:  Nuclear sclerotic cataract right eye.  H25.11   POSTOPERATIVE DIAGNOSIS:    Nuclear sclerotic cataract right eye.     PROCEDURE:  Phacoemusification with posterior chamber intraocular lens placement of the right eye   LENS:   Implant Name Type Inv. Item Serial No. Manufacturer Lot No. LRB No. Used Action  LENS IOL TECNIS EYHANCE 23.0 - [id number] Intraocular Lens LENS IOL TECNIS EYHANCE 23.0 [id number] [employer]IGHTPATH  Right 1 Implanted       Procedure(s): PHACOEMULSIFICATION, CATARACT, WITH IOL INSERTION 28.40 02:13.8 (Right)  SURGEON:  Willey Blade, MD, MPH  ANESTHESIOLOGIST: Anesthesiologist: Marisue Humble, MD CRNA: Andee Poles, CRNA   ANESTHESIA:  Topical with tetracaine drops augmented with 1% preservative-free intracameral lidocaine.  ESTIMATED BLOOD LOSS: less than 1 mL.   COMPLICATIONS:  None.   DESCRIPTION OF PROCEDURE:  The patient was identified in the holding room and transported to the operating room and placed in the supine position under the operating microscope.  The right eye was identified as the operative eye and it was prepped and draped in the usual sterile ophthalmic fashion.   A 1.0 millimeter clear-corneal paracentesis was made at the 10:30 position. 0.5 ml of preservative-free 1% lidocaine with epinephrine was injected into the anterior chamber.  The anterior chamber was filled with viscoelastic.  A 2.4 millimeter keratome was used to make a near-clear corneal incision at the 8:00 position.  A curvilinear capsulorrhexis was made with a cystotome and capsulorrhexis forceps.  Balanced salt solution was used to hydrodissect and hydrodelineate the nucleus.   Phacoemulsification was then used in stop and chop fashion to remove the lens nucleus and epinucleus.  The remaining cortex was then removed using the irrigation and aspiration handpiece. Viscoelastic was then placed into the  capsular bag to distend it for lens placement.  A lens was then injected into the capsular bag.  The remaining viscoelastic was aspirated.   Wounds were hydrated with balanced salt solution.  The anterior chamber was inflated to a physiologic pressure with balanced salt solution.   Intracameral vigamox 0.1 mL undiluted was injected into the eye and a drop placed onto the ocular surface.  No wound leaks were noted.  The patient was taken to the recovery room in stable condition without complications of anesthesia or surgery  Willey Blade 01/12/2024, 9:42 AM

## 2024-01-12 NOTE — H&P (Signed)
 [facility]   Primary Care Physician:  Lauro Regulus, MD Ophthalmologist: Dr. Willey Blade  Pre-Procedure History & Physical: HPI:  Bradley Wolf is a 89 y.o. male here for cataract surgery.   Past Medical History:  Diagnosis Date   BPH (benign prostatic hyperplasia)    Chronic cough    CKD (chronic kidney disease) stage 3, GFR 30-59 ml/min (HCC) 03/19/2014   Depression    Elevated PSA    Gastric ulcer    Generalized weakness    Hiatal hernia    HTN (hypertension)    Left groin pain    Left hip pain    PUD (peptic ulcer disease)    Reflux esophagitis    SOB (shortness of breath)    Wears dentures    full upper and lower   Wears hearing aid in both ears     Past Surgical History:  Procedure Laterality Date   CATARACT EXTRACTION W/PHACO Left 12/29/2023   Procedure: PHACOEMULSIFICATION, CATARACT, WITH IOL INSERTION 34.16 02:36.7;  Surgeon: Nevada Crane, MD;  Location[facility];  Service: Ophthalmology;  Laterality: Left;   CHOLECYSTECTOMY     cirrhosis     ESOPHAGOGASTRODUODENOSCOPY (EGD) WITH PROPOFOL N/A 01/19/2020   Procedure: ESOPHAGOGASTRODUODENOSCOPY (EGD) WITH PROPOFOL;  Surgeon: Toledo, Boykin Nearing, MD;  Location: [facility];  Service: Gastroenterology;  Laterality: N/A;   LEG SURGERY  2004   due to accident   this is a knee surgery    Prior to Admission medications   Medication Sig Start Date End Date Taking? Authorizing Provider  amitriptyline (ELAVIL) 10 MG tablet Take 20 mg by mouth at bedtime.   Yes [provider]  cyanocobalamin (VITAMIN B12) 1000 MCG tablet Take by mouth.   Yes [provider]  finasteride (PROSCAR) 5 MG tablet Take 1 tablet by mouth once daily 11/05/23  Yes McGowan, Carollee Herter A, PA-C  gabapentin (NEURONTIN) 300 MG capsule Take 300 mg by mouth.   Yes [provider]  ibuprofen (ADVIL) 600 MG tablet Take 1 tablet (600 mg total) by mouth every 6 (six) hours as needed. 06/28/22  Yes  Lynden Oxford R, PA-C  Ipratropium-Albuterol (COMBIVENT RESPIMAT) 20-100 MCG/ACT AERS respimat Inhale 1 puff into the lungs every 6 (six) hours.   Yes [provider]  Omega-3 Fatty Acids (FISH OIL) 1000 MG CAPS Take by mouth daily.   Yes [provider]  risperiDONE (RISPERDAL) 0.5 MG tablet Take 0.5 mg by mouth 2 (two) times daily.   Yes [provider]  senna (SENOKOT) 8.6 MG TABS tablet Take 1 tablet by mouth.   Yes [provider]  tamsulosin (FLOMAX) 0.4 MG CAPS capsule Take 1 capsule (0.4 mg total) by mouth daily. 06/24/23  Yes McGowan, Carollee Herter A, PA-C  pantoprazole (PROTONIX) 40 MG tablet Take by mouth. 04/18/16 12/22/23  [provider]  tadalafil (CIALIS) 5 MG tablet Take 1 tablet (5 mg total) by mouth daily as needed for erectile dysfunction. Patient not taking: Reported on 12/22/2023 06/24/23   Michiel Cowboy A, PA-C    Allergies as of 12/15/2023   (No Known Allergies)    Family History  Problem Relation Age of Onset   Benign prostatic hyperplasia Brother    Prostate cancer Neg Hx    Kidney disease Neg Hx    Kidney cancer Neg Hx    Bladder Cancer Neg Hx     Social History   Socioeconomic History   Marital status: Widowed    Spouse  name: Not on file   Number of children: Not on file   Years of education: Not on file   Highest education level: Not on file  Occupational History   Not on file  Tobacco Use   Smoking status: Former    Current packs/day: 0.00    Types: Cigarettes    Quit date: 71    Years since quitting: 60.3   Smokeless tobacco: Never  Vaping Use   Vaping status: Never Used  Substance and Sexual Activity   Alcohol use: No    Alcohol/week: 0.0 standard drinks of alcohol   Drug use: No   Sexual activity: Not on file  Other Topics Concern   Not on file  Social History Narrative   Not on file   Social Drivers of Health   Financial Resource Strain: Low Risk  (11/13/2023)   Received from [facility]   Overall Financial Resource Strain (CARDIA)    Difficulty of Paying Living Expenses: Not hard at all  Food Insecurity: No Food Insecurity (11/13/2023)   Received from [facility]   Hunger Vital Sign    Worried About Running Out of Food in the Last Year: Never true    Ran Out of Food in the Last Year: Never true  Transportation Needs: No Transportation Needs (11/13/2023)   Received from [facility]    In the past 12 months, has lack of transportation kept you from medical appointments or from getting medications?: No    Lack of Transportation (Non-Medical): No  Physical Activity: Not on file  Stress: Not on file  Social Connections: Not on file  Intimate Partner Violence: Not At Risk (06/26/2022)   Humiliation, Afraid, Rape, and Kick questionnaire    Fear of Current or Ex-Partner: No    Emotionally Abused: No    Physically Abused: No    Sexually Abused: No    Review of Systems: See HPI, otherwise negative ROS  Physical Exam: BP (!) 155/59   Pulse 70   Temp 98.2 F (36.8 C) (Temporal)   Resp 14   Ht 5' 2.99" (1.6 m)   Wt 71.4 kg   SpO2 96%   BMI 27.87 kg/m  General:   Alert, cooperative in NAD Head:  Normocephalic and atraumatic. Respiratory:  Normal work of breathing. Cardiovascular:  RRR  Impression/Plan: Bradley Wolf is here for cataract surgery.  Risks, benefits, limitations, and alternatives regarding cataract surgery have been reviewed with the patient.  Questions have been answered.  All parties agreeable.   Willey Blade, MD  01/12/2024, 9:05 AM

## 2024-01-12 NOTE — Transfer of Care (Signed)
 Immediate Anesthesia Transfer of Care Note  Patient: Bradley Wolf  Procedure(s) Performed: PHACOEMULSIFICATION, CATARACT, WITH IOL INSERTION 28.40 02:13.8 (Right: Eye)  Patient Location: PACU  Anesthesia Type: MAC  Level of Consciousness: awake, alert  and patient cooperative  Airway and Oxygen Therapy: Patient Spontanous Breathing and Patient connected to supplemental oxygen  Post-op Assessment: Post-op Vital signs reviewed, Patient's Cardiovascular Status Stable, Respiratory Function Stable, Patent Airway and No signs of Nausea or vomiting  Post-op Vital Signs: Reviewed and stable  Complications: No notable events documented.

## 2024-01-12 NOTE — Anesthesia Postprocedure Evaluation (Signed)
 Anesthesia Post Note  Patient: EHAB HUMBER Ramos  Procedure(s) Performed: PHACOEMULSIFICATION, CATARACT, WITH IOL INSERTION 28.40 02:13.8 (Right: Eye)  Patient location during evaluation: PACU Anesthesia Type: MAC Level of consciousness: awake and alert Pain management: pain level controlled Vital Signs Assessment: post-procedure vital signs reviewed and stable Respiratory status: spontaneous breathing, nonlabored ventilation, respiratory function stable and patient connected to nasal cannula oxygen Cardiovascular status: stable and blood pressure returned to baseline Postop Assessment: no apparent nausea or vomiting Anesthetic complications: no   No notable events documented.   Last Vitals:  Vitals:   01/12/24 0945 01/12/24 0950  BP: 99/85 (!) 143/62  Pulse: 68 70  Resp: 12 18  Temp: (!) 36.3 C (!) 36.3 C  SpO2: 96% 97%    Last Pain:  Vitals:   01/12/24 0950  TempSrc:   PainSc: 0-No pain                 Nzinga Ferran C Chayton Murata

## 2024-01-21 ENCOUNTER — Other Ambulatory Visit: Payer: Self-pay | Admitting: Urology

## 2024-01-21 DIAGNOSIS — N138 Other obstructive and reflux uropathy: Secondary | ICD-10-CM

## 2024-02-16 ENCOUNTER — Other Ambulatory Visit: Payer: Self-pay | Admitting: Urology

## 2024-02-16 DIAGNOSIS — N138 Other obstructive and reflux uropathy: Secondary | ICD-10-CM

## 2024-05-10 ENCOUNTER — Encounter: Payer: Self-pay | Admitting: Urology

## 2024-06-23 ENCOUNTER — Ambulatory Visit: Payer: Self-pay | Admitting: Urology

## 2024-06-23 NOTE — Progress Notes (Deleted)
 10:58 PM   Bradley Wolf [birth year] [id number]  Referring provider: Lenon Wolf ORN, MD [address] [facility] [place],  [place] [address]  Urological history: 1. BPH with LU TS -finasteride  5 mg daily, tamsulosin  0.4 mg daily and tadalafil  5 mg daily  No chief complaint on file.  HPI: Bradley Wolf is a 89 y.o. man who presents today for yearly follow up with his son, Bradley Wolf.    Previous records reviewed.    I PSS ***  He reports sensation of incomplete bladder emptying, urinary frequency, urinary intermittency, urinary urgency, a weak urinary stream, having to strain to void, nocturia x ***, leaking before being able to reach the restroom, leaking with coughing, leaking without awareness, and post void dribbling.     He is wearing *** pads//depends  daily.    Patient denies any modifying or aggravating factors.  Patient denies any recent UTI's, gross hematuria, dysuria or suprapubic/flank pain.  Patient denies any fevers, chills, nausea or vomiting.  ***  He has a family history of PCa, colon cancer, ovarian cancer and/or breast cancer with ***.   He does not have a family history of PCa, colon cancer, ovarian cancer, and/or breast cancer .***     UA***  PVR***  Serum creatinine (05/2024) 1.2, eGFR 57  Hemoglobin A1c (05/2024) 6.4  Diuretics:  ***  Fluid consumptiom: ***     PMH: Past Medical History:  Diagnosis Date   BPH (benign prostatic hyperplasia)    Chronic cough    CKD (chronic kidney disease) stage 3, GFR 30-59 ml/min (HCC) 03/19/2014   Depression    Elevated PSA    Gastric ulcer    Generalized weakness    Hiatal hernia    HTN (hypertension)    Left groin pain    Left hip pain    PUD (peptic ulcer disease)    Reflux esophagitis    SOB (shortness of breath)    Wears dentures    full upper and lower   Wears hearing aid in both ears     Surgical History: Past Surgical History:  Procedure Laterality  Date   CATARACT EXTRACTION W/PHACO Left 12/29/2023   Procedure: PHACOEMULSIFICATION, CATARACT, WITH IOL INSERTION 34.16 02:36.7;  Surgeon: Bradley Adine Anes, MD;  Location: [facility];  Service: Ophthalmology;  Laterality: Left;   CATARACT EXTRACTION W/PHACO Right 01/12/2024   Procedure: PHACOEMULSIFICATION, CATARACT, WITH IOL INSERTION 28.40 02:13.8;  Surgeon: Bradley Adine Anes, MD;  Location: [facility];  Service: Ophthalmology;  Laterality: Right;   CHOLECYSTECTOMY     cirrhosis     ESOPHAGOGASTRODUODENOSCOPY (EGD) WITH PROPOFOL  N/A 01/19/2020   Procedure: ESOPHAGOGASTRODUODENOSCOPY (EGD) WITH PROPOFOL ;  Surgeon: Toledo, Ladell POUR, MD;  Location: [facility];  Service: Gastroenterology;  Laterality: N/A;   LEG SURGERY  2004   due to accident   this is a knee surgery    Home Medications:  Allergies as of 06/24/2024   No Known Allergies      Medication List        Accurate as of June 23, 2024 10:58 PM. If you have any questions, ask your nurse or doctor.          amitriptyline  10 MG tablet Commonly known as: ELAVIL  Take 20 mg by mouth at bedtime.   Combivent Respimat 20-100 MCG/ACT Aers respimat Generic drug: Ipratropium-Albuterol Inhale 1 puff into the lungs every 6 (six) hours.   cyanocobalamin 1000 MCG tablet Commonly known  as: VITAMIN B12 Take by mouth.   finasteride  5 MG tablet Commonly known as: PROSCAR  Take 1 tablet by mouth once daily   Fish Oil 1000 MG Caps Take by mouth daily.   gabapentin  300 MG capsule Commonly known as: NEURONTIN  Take 300 mg by mouth.   ibuprofen  600 MG tablet Commonly known as: ADVIL  Take 1 tablet (600 mg total) by mouth every 6 (six) hours as needed.   pantoprazole  40 MG tablet Commonly known as: PROTONIX  Take by mouth.   risperiDONE  0.5 MG tablet Commonly known as: RISPERDAL  Take 0.5 mg by mouth 2 (two) times daily.   senna 8.6 MG Tabs tablet Commonly known as: SENOKOT Take 1 tablet by mouth.    tadalafil  5 MG tablet Commonly known as: CIALIS  TAKE 1 TABLET BY MOUTH DAILY AS NEEDED FOR ERECTILE DYSFUNCTION   tamsulosin  0.4 MG Caps capsule Commonly known as: FLOMAX  Take 1 capsule (0.4 mg total) by mouth daily.        Allergies: No Known Allergies  Family History: Family History  Problem Relation Age of Onset   Benign prostatic hyperplasia Brother    Prostate cancer Neg Hx    Kidney disease Neg Hx    Kidney cancer Neg Hx    Bladder Cancer Neg Hx     Social History:  reports that he quit smoking about 60 years ago. His smoking use included cigarettes. He has never used smokeless tobacco. He reports that he does not drink alcohol and does not use drugs.  ROS: For pertinent review of systems please refer to history of present illness  Physical Exam: There were no vitals taken for this visit.  Constitutional:  Well nourished. Alert and oriented, No acute distress. HEENT:  AT, moist mucus membranes.  Trachea midline, no masses. Cardiovascular: No clubbing, cyanosis, or edema. Respiratory: Normal respiratory effort, no increased work of breathing. GI: Abdomen is soft, non tender, non distended, no abdominal masses. Liver and spleen not palpable.  No hernias appreciated.  Stool sample for occult testing is not indicated.   GU: No CVA tenderness.  No bladder fullness or masses.  Patient with circumcised/uncircumcised phallus. ***Foreskin easily retracted***  Urethral meatus is patent.  No penile discharge. No penile lesions or rashes. Scrotum without lesions, cysts, rashes and/or edema.  Testicles are located scrotally bilaterally. No masses are appreciated in the testicles. Left and right epididymis are normal. Rectal: Patient with  normal sphincter tone. Anus and perineum without scarring or rashes. No rectal masses are appreciated. Prostate is approximately *** grams, *** nodules are appreciated. Seminal vesicles are normal. Skin: No rashes, bruises or suspicious  lesions. Lymph: No cervical or inguinal adenopathy. Neurologic: Grossly intact, no focal deficits, moving all 4 extremities. Psychiatric: Normal mood and affect.   Laboratory Data: See HPI and EPIC  I have reviewed the labs.  See HPI.     Pertinent imaging N/A     Assessment & Plan:    1. BPH with LUTS -aged out of screening -at goal w/ medications  -continue tamsulosin  0.4 mg daily, tadalafil  5 mg and finasteride  5 mg daily   No follow-ups on file.  CLOTILDA HELON RIGGERS  [facility] [address] [place], [place] [address] [phone]

## 2024-06-24 ENCOUNTER — Ambulatory Visit: Admitting: Urology

## 2024-06-24 DIAGNOSIS — N138 Other obstructive and reflux uropathy: Secondary | ICD-10-CM

## 2024-08-10 NOTE — Progress Notes (Signed)
 5:13 PM   ASER NYLUND Ramos 06/21/1929 [id number]  Referring provider: Lenon Layman ORN, MD [address] [facility] [place],  [place] [address]  Urological history: 1. BPH with LU TS -finasteride  5 mg daily, tamsulosin  0.4 mg daily and tadalafil  5 mg daily  Chief Complaint  Patient presents with   Benign Prostatic Hypertrophy   HPI: Bradley Wolf is a 89 y.o. male who presents today for yearly follow up with his son, [name] and interpreter, Mliss.   Previous records reviewed.     I PSS 8/2  He reports sensation of urinary frequency, urinary intermittency, a weak urinary stream, having to strain to void and nocturia x 2.  Patient denies any modifying or aggravating factors.  Patient denies any recent UTI's, gross hematuria, dysuria or suprapubic/flank pain.  Patient denies any fevers, chills, nausea or vomiting.    Serum creatinine (05/2024) 1.2, eGFR 57  Hemoglobin A1c (05/2024) 6.4  BPH meds: Tamsulosin  0.4 mg daily, tadalafil  5 mg daily and finasteride  5 mg daily.   PMH: Past Medical History:  Diagnosis Date   BPH (benign prostatic hyperplasia)    Chronic cough    CKD (chronic kidney disease) stage 3, GFR 30-59 ml/min (HCC) 03/19/2014   Depression    Elevated PSA    Gastric ulcer    Generalized weakness    Hiatal hernia    HTN (hypertension)    Left groin pain    Left hip pain    PUD (peptic ulcer disease)    Reflux esophagitis    SOB (shortness of breath)    Wears dentures    full upper and lower   Wears hearing aid in both ears     Surgical History: Past Surgical History:  Procedure Laterality Date   CATARACT EXTRACTION W/PHACO Left 12/29/2023   Procedure: PHACOEMULSIFICATION, CATARACT, WITH IOL INSERTION 34.16 02:36.7;  Surgeon: Myrna Adine Anes, MD;  Location: [facility];  Service: Ophthalmology;  Laterality: Left;   CATARACT EXTRACTION W/PHACO Right 01/12/2024   Procedure: PHACOEMULSIFICATION, CATARACT,  WITH IOL INSERTION 28.40 02:13.8;  Surgeon: Myrna Adine Anes, MD;  Location: [facility];  Service: Ophthalmology;  Laterality: Right;   CHOLECYSTECTOMY     cirrhosis     ESOPHAGOGASTRODUODENOSCOPY (EGD) WITH PROPOFOL  N/A 01/19/2020   Procedure: ESOPHAGOGASTRODUODENOSCOPY (EGD) WITH PROPOFOL ;  Surgeon: Toledo, Ladell POUR, MD;  Location: [facility];  Service: Gastroenterology;  Laterality: N/A;   LEG SURGERY  2004   due to accident   this is a knee surgery    Home Medications:  Allergies as of 08/12/2024   No Known Allergies      Medication List        Accurate as of August 12, 2024 11:59 PM. If you have any questions, ask your nurse or doctor.          amitriptyline  10 MG tablet Commonly known as: ELAVIL  Take 20 mg by mouth at bedtime.   Combivent Respimat 20-100 MCG/ACT Aers respimat Generic drug: Ipratropium-Albuterol Inhale 1 puff into the lungs every 6 (six) hours.   cyanocobalamin 1000 MCG tablet Commonly known as: VITAMIN B12 Take by mouth.   finasteride  5 MG tablet Commonly known as: PROSCAR  Take 1 tablet (5 mg total) by mouth daily.   Fish Oil 1000 MG Caps Take by mouth daily.   gabapentin  300 MG capsule Commonly known as: NEURONTIN  Take 300 mg by mouth.   ibuprofen  600 MG tablet Commonly known as: ADVIL  Take  1 tablet (600 mg total) by mouth every 6 (six) hours as needed.   pantoprazole  40 MG tablet Commonly known as: PROTONIX  Take by mouth.   risperiDONE  0.5 MG tablet Commonly known as: RISPERDAL  Take 0.5 mg by mouth 2 (two) times daily.   senna 8.6 MG Tabs tablet Commonly known as: SENOKOT Take 1 tablet by mouth.   tadalafil  5 MG tablet Commonly known as: CIALIS  Take 1 tablet (5 mg total) by mouth daily. What changed: See the new instructions. Changed by: CLOTILDA CORNWALL   tamsulosin  0.4 MG Caps capsule Commonly known as: FLOMAX  Take 1 capsule (0.4 mg total) by mouth daily.        Allergies: No Known  Allergies  Family History: Family History  Problem Relation Age of Onset   Benign prostatic hyperplasia Brother    Prostate cancer Neg Hx    Kidney disease Neg Hx    Kidney cancer Neg Hx    Bladder Cancer Neg Hx     Social History:  reports that he quit smoking about 60 years ago. His smoking use included cigarettes. He has never used smokeless tobacco. He reports that he does not drink alcohol and does not use drugs.  ROS: For pertinent review of systems please refer to history of present illness  Physical Exam: BP (!) 149/73   Pulse 75   Wt 154 lb (69.9 kg)   BMI 27.29 kg/m   Constitutional:  Well nourished. Alert and oriented, No acute distress. HEENT: [place] AT, moist mucus membranes.  Trachea midline Cardiovascular: No clubbing, cyanosis, or edema. Respiratory: Normal respiratory effort, no increased work of breathing. Neurologic: Grossly intact, no focal deficits, moving all 4 extremities. Psychiatric: Normal mood and affect.   Laboratory Data: See Epic and HPI   I have reviewed the labs.  See HPI.     Pertinent imaging  N/A   Assessment & Plan:    1. BPH with LUTS -aged out of screening -at goal w/ medications  -continue tamsulosin  0.4 mg daily, tadalafil  5 mg and finasteride  5 mg daily   Return in about 1 year (around 08/12/2025) for I PSS .  CLOTILDA CORNWALL RIGGERS  [facility] [address] [place], [place] [address] [phone]

## 2024-08-12 ENCOUNTER — Ambulatory Visit (INDEPENDENT_AMBULATORY_CARE_PROVIDER_SITE_OTHER): Admitting: Urology

## 2024-08-12 ENCOUNTER — Encounter: Payer: Self-pay | Admitting: Urology

## 2024-08-12 VITALS — BP 149/73 | HR 75 | Wt 154.0 lb

## 2024-08-12 DIAGNOSIS — N138 Other obstructive and reflux uropathy: Secondary | ICD-10-CM | POA: Diagnosis not present

## 2024-08-12 DIAGNOSIS — N401 Enlarged prostate with lower urinary tract symptoms: Secondary | ICD-10-CM

## 2024-08-12 MED ORDER — TAMSULOSIN HCL 0.4 MG PO CAPS
0.4000 mg | ORAL_CAPSULE | Freq: Every day | ORAL | 3 refills | Status: AC
Start: 2024-08-12 — End: ?

## 2024-08-12 MED ORDER — TADALAFIL 5 MG PO TABS: 5.0000 mg | ORAL_TABLET | Freq: Every day | ORAL | 3 refills | Status: AC

## 2024-08-12 MED ORDER — FINASTERIDE 5 MG PO TABS: 5.0000 mg | ORAL_TABLET | Freq: Every day | ORAL | 3 refills | Status: AC

## 2025-08-12 ENCOUNTER — Ambulatory Visit: Admitting: Urology
